# Patient Record
Sex: Male | Born: 1975 | Race: Black or African American | Hispanic: No | Marital: Single | Smoking: Former smoker
Health system: Southern US, Community
[De-identification: ages and names within clinical notes are randomized; demographics above are authoritative.]

---

## 2002-01-06 ENCOUNTER — Emergency Department (HOSPITAL_COMMUNITY): Admission: EM | Admit: 2002-01-06 | Discharge: 2002-01-06 | Payer: Self-pay | Admitting: Emergency Medicine

## 2002-01-07 ENCOUNTER — Emergency Department (HOSPITAL_COMMUNITY): Admission: EM | Admit: 2002-01-07 | Discharge: 2002-01-07 | Payer: Self-pay

## 2003-02-06 ENCOUNTER — Encounter: Payer: Self-pay | Admitting: Emergency Medicine

## 2003-02-06 ENCOUNTER — Emergency Department (HOSPITAL_COMMUNITY): Admission: EM | Admit: 2003-02-06 | Discharge: 2003-02-07 | Payer: Self-pay | Admitting: Emergency Medicine

## 2003-02-14 ENCOUNTER — Emergency Department (HOSPITAL_COMMUNITY): Admission: AD | Admit: 2003-02-14 | Discharge: 2003-02-14 | Payer: Self-pay | Admitting: Emergency Medicine

## 2007-10-15 ENCOUNTER — Emergency Department (HOSPITAL_COMMUNITY): Admission: EM | Admit: 2007-10-15 | Discharge: 2007-10-16 | Payer: Self-pay | Admitting: Emergency Medicine

## 2011-03-25 ENCOUNTER — Inpatient Hospital Stay (HOSPITAL_COMMUNITY)
Admission: RE | Admit: 2011-03-25 | Payer: Self-pay | Source: Ambulatory Visit | Admitting: Thoracic Surgery (Cardiothoracic Vascular Surgery)

## 2011-05-18 ENCOUNTER — Ambulatory Visit (INDEPENDENT_AMBULATORY_CARE_PROVIDER_SITE_OTHER): Payer: BC Managed Care – PPO

## 2011-05-18 DIAGNOSIS — Z79899 Other long term (current) drug therapy: Secondary | ICD-10-CM

## 2015-09-30 ENCOUNTER — Ambulatory Visit (INDEPENDENT_AMBULATORY_CARE_PROVIDER_SITE_OTHER): Payer: BLUE CROSS/BLUE SHIELD | Admitting: Family Medicine

## 2015-09-30 VITALS — BP 110/78 | HR 78 | Temp 97.7°F | Resp 16 | Ht 70.0 in | Wt 173.0 lb

## 2015-09-30 DIAGNOSIS — R635 Abnormal weight gain: Secondary | ICD-10-CM

## 2015-09-30 DIAGNOSIS — F418 Other specified anxiety disorders: Secondary | ICD-10-CM | POA: Diagnosis not present

## 2015-09-30 LAB — TSH: TSH: 1.15 mIU/L (ref 0.40–4.50)

## 2015-09-30 MED ORDER — SERTRALINE HCL 100 MG PO TABS
100.0000 mg | ORAL_TABLET | Freq: Every day | ORAL | Status: DC
Start: 2015-09-30 — End: 2016-01-03

## 2015-09-30 NOTE — Progress Notes (Signed)
Subjective:  By signing my name below, I, Eric Baldwin, attest that this documentation has been prepared under the direction and in the presence of Eric Ray, MD. Electronically Signed: Moises Baldwin, [place]. 09/30/2015 , 9:46 AM .  Patient was seen in Room 12 .   Patient ID: Eric Baldwin, male    DOB: March 23, 1976, 40 y.o.   MRN: [id number] Chief Complaint  Patient presents with  . Other    pt wants zoloft and wants to be seen for anxiety   HPI Eric Baldwin is a 40 y.o. male Here to be seen for anxiety.   Patient reports being a previous patient at [facility] psychiatry at [facility] for 1-2 years, last seen over a year ago. He states his insurance doesn't cover him there anymore. He was prescribed zoloft and wellbutrin at the same time for depression. He's been off of his medications for a little over a year. He's tried taking a friend's prozac, about 4-5 tablets. He denies any troubles coming off of his medications. He denies any complications when he was taking his medications.   He states anxiousness when driving started out of the blue for a little over a year. It occurs [place] he goes home to see his mother in [place]. He denies seeing a therapist. He had some unexpected weight gain with appetite change and some dryness in his skin. He also mentions some loss in concentration. He denies any cold or hot tolerance.   He's been trying to do push ups everyday and some weight exercises. He had suicidal thoughts a few years ago, but not recently. He stopped smoking cigarettes and now smokes vapor. He drinks about 3-4 shots on the weekends. He denies any alcohol problems.   He works as a Engineering geologist at [employer].   There are no active problems to display for this patient.  History reviewed. No pertinent past medical history. History reviewed. No pertinent past surgical history. No Known Allergies Prior to Admission medications   Not on File    Social History   Social History  . Marital Status: Single    Spouse Name: N/A  . Number of Children: N/A  . Years of Education: N/A   Occupational History  . Not on file.   Social History Main Topics  . Smoking status: Current Every Day Smoker  . Smokeless tobacco: Not on file  . Alcohol Use: Not on file  . Drug Use: Not on file  . Sexual Activity: Not on file   Other Topics Concern  . Not on file   Social History Narrative  . No narrative on file   Review of Systems  Constitutional: Positive for appetite change, fatigue and unexpected weight change. Negative for fever and chills.  Gastrointestinal: Negative for nausea, vomiting and diarrhea.  Endocrine: Negative for cold intolerance and heat intolerance.  Psychiatric/Behavioral: Positive for decreased concentration. Negative for suicidal ideas. The patient is nervous/anxious.        Objective:   Physical Exam  Constitutional: He is oriented to person, place, and time. He appears well-developed and well-nourished. No distress.  HENT:  Head: Normocephalic and atraumatic.  Eyes: EOM are normal. Pupils are equal, round, and reactive to light.  Neck: Neck supple.  Cardiovascular: Normal rate.   Pulmonary/Chest: Effort normal. No respiratory distress.  Musculoskeletal: Normal range of motion.  Neurological: He is alert and oriented to person, place, and time.  Skin: Skin is warm and dry.  Psychiatric: He  has a normal mood and affect. His behavior is normal.  Nursing note and vitals reviewed.   Filed Vitals:   09/30/15 0905  BP: 110/78  Pulse: 78  Temp: 97.7 F (36.5 C)  TempSrc: Oral  Resp: 16  Height: 5' 10"  (1.778 m)  Weight: 173 lb (78.472 kg)  SpO2: 98%      Assessment & Plan:   Faust Thorington is a 40 y.o. male Depression with anxiety - Plan: TSH, sertraline (ZOLOFT) 100 MG tablet  Weight gain - Plan: TSH  Previous history of depression with recent onset of anxiety past year. Situational anxiety  with driving primarily. Denies suicidal ideation or homicidal ideation. Denies alcohol abuse/addiction. Did discuss cutting back to 1-2 drinks at the most weekends.  -Restart Zoloft at 50 mg daily for 1 week, then increase to 100 mg daily, as he thinks he was at the higher dose previously. Stress management and relaxation techniques discussed, especially with driving. Decided against benzodiazepine as would not want to necessarily start this while he operates a vehicle.  -Phone numbers given for cognitive behavioral therapy/counseling.  -Recheck 4 weeks, sooner if worse. Records requested from previous psychiatrist.   Meds ordered this encounter  Medications  . sertraline (ZOLOFT) 100 MG tablet    Sig: Take 1 tablet (100 mg total) by mouth daily. Start with one half pill per day for first week, then increase to one pill per day.    Dispense:  30 tablet    Refill:  2   Patient Instructions       IF you received an x-Baldwin today, you will receive an invoice from [facility]. Please contact [facility] at [phone] with questions or concerns regarding your invoice.   IF you received labwork today, you will receive an invoice from [employer]. Please contact [facility] at [phone] with questions or concerns regarding your invoice.   Our billing staff will not be able to assist you with questions regarding bills from these companies.  You will be contacted with the lab results as soon as they are available. The fastest way to get your results is to activate your My Chart account. Instructions are located on the last page of this paperwork. If you have not heard from [place] regarding the results in 2 weeks, please contact this office.     Start medication once per day, call one of the mental health providers below to schedule counseling to help with management of anxiety symptoms, and continue some form of exercise each day. Follow-up with me in the  next 4 weeks, sooner if worse. Work on IT trainer as we discussed.   Return to the clinic or go to the nearest emergency room if any of your symptoms worsen or new symptoms occur.  Vivia Budge: [phone] Arvil Chaco: [phone]   Stress and Stress Management Stress is a normal reaction to life events. It is what you feel when life demands more than you are used to or more than you can handle. Some stress can be useful. For example, the stress reaction can help you catch the last bus of the day, study for a test, or meet a deadline at work. But stress that occurs too often or for too long can cause problems. It can affect your emotional health and interfere with relationships and normal daily activities. Too much stress can weaken your immune system and increase your risk for physical illness. If you already have a medical problem, stress can make it  worse. CAUSES  All sorts of life events may cause stress. An event that causes stress for one person may not be stressful for another person. Major life events commonly cause stress. These may be positive or negative. Examples include losing your job, moving into a new home, getting married, having a baby, or losing a loved one. Less obvious life events may also cause stress, especially if they occur day after day or in combination. Examples include working long hours, driving in traffic, caring for children, being in debt, or being in a difficult relationship. SIGNS AND SYMPTOMS Stress may cause emotional symptoms including, the following:  Anxiety. This is feeling worried, afraid, on edge, overwhelmed, or out of control.  Anger. This is feeling irritated or impatient.  Depression. This is feeling sad, down, helpless, or guilty.  Difficulty focusing, remembering, or making decisions. Stress may cause physical symptoms, including the following:   Aches and pains. These may affect your head, neck, back, stomach, or other areas of your  body.  Tight muscles or clenched jaw.  Low energy or trouble sleeping. Stress may cause unhealthy behaviors, including the following:   Eating to feel better (overeating) or skipping meals.  Sleeping too little, too much, or both.  Working too much or putting off tasks (procrastination).  Smoking, drinking alcohol, or using drugs to feel better. DIAGNOSIS  Stress is diagnosed through an assessment by your health care provider. Your health care provider will ask questions about your symptoms and any stressful life events.Your health care provider will also ask about your medical history and may order Baldwin tests or other tests. Certain medical conditions and medicine can cause physical symptoms similar to stress. Mental illness can cause emotional symptoms and unhealthy behaviors similar to stress. Your health care provider may refer you to a mental health professional for further evaluation.  TREATMENT  Stress management is the recommended treatment for stress.The goals of stress management are reducing stressful life events and coping with stress in healthy ways.  Techniques for reducing stressful life events include the following:  Stress identification. Self-monitor for stress and identify what causes stress for you. These skills may help you to avoid some stressful events.  Time management. Set your priorities, keep a calendar of events, and learn to say "no." These tools can help you avoid making too many commitments. Techniques for coping with stress include the following:  Rethinking the problem. Try to think realistically about stressful events rather than ignoring them or overreacting. Try to find the positives in a stressful situation rather than focusing on the negatives.  Exercise. Physical exercise can release both physical and emotional tension. The key is to find a form of exercise you enjoy and do it regularly.  Relaxation techniques. These relax the body and mind.  Examples include yoga, meditation, tai chi, biofeedback, deep breathing, progressive muscle relaxation, listening to music, being out in nature, journaling, and other hobbies. Again, the key is to find one or more that you enjoy and can do regularly.  Healthy lifestyle. Eat a balanced diet, get plenty of sleep, and do not smoke. Avoid using alcohol or drugs to relax.  Strong support network. Spend time with family, friends, or other people you enjoy being around.Express your feelings and talk things over with someone you trust. Counseling or talktherapy with a mental health professional may be helpful if you are having difficulty managing stress on your own. Medicine is typically not recommended for the treatment of stress.Talk to your health care  provider if you think you need medicine for symptoms of stress. HOME CARE INSTRUCTIONS  Keep all follow-up visits as directed by your health care provider.  Take all medicines as directed by your health care provider. SEEK MEDICAL CARE IF:  Your symptoms get worse or you start having new symptoms.  You feel overwhelmed by your problems and can no longer manage them on your own. SEEK IMMEDIATE MEDICAL CARE IF:  You feel like hurting yourself or someone else.   This information is not intended to replace advice given to you by your health care provider. Make sure you discuss any questions you have with your health care provider.   Document Released: 11/17/2000 Document Revised: 06/14/2014 Document Reviewed: 01/16/2013 Elsevier Interactive Patient Education [employer].     I personally performed the services described in this documentation, which was scribed in my presence. The recorded information has been reviewed and considered, and addended by me as needed.

## 2015-09-30 NOTE — Patient Instructions (Addendum)
IF you received an x-ray today, you will receive an invoice from [facility]. Please contact [facility] at [phone] with questions or concerns regarding your invoice.   IF you received labwork today, you will receive an invoice from [facility] [employer]. Please contact [facility] at [phone] with questions or concerns regarding your invoice.   Our billing staff will not be able to assist you with questions regarding bills from these companies.  You will be contacted with the lab results as soon as they are available. The fastest way to get your results is to activate your My Chart account. Instructions are located on the last page of this paperwork. If you have not heard from [place] regarding the results in 2 weeks, please contact this office.     Start medication once per day, call one of the mental health providers below to schedule counseling to help with management of anxiety symptoms, and continue some form of exercise each day. Follow-up with me in the next 4 weeks, sooner if worse. Work on IT trainer as we discussed.   Return to the clinic or go to the nearest emergency room if any of your symptoms worsen or new symptoms occur.  Vivia Budge: [phone] Arvil Chaco: [phone]   Stress and Stress Management Stress is a normal reaction to life events. It is what you feel when life demands more than you are used to or more than you can handle. Some stress can be useful. For example, the stress reaction can help you catch the last bus of the day, study for a test, or meet a deadline at work. But stress that occurs too often or for too long can cause problems. It can affect your emotional health and interfere with relationships and normal daily activities. Too much stress can weaken your immune system and increase your risk for physical illness. If you already have a medical problem, stress can make it worse. CAUSES  All sorts of life  events may cause stress. An event that causes stress for one person may not be stressful for another person. Major life events commonly cause stress. These may be positive or negative. Examples include losing your job, moving into a new home, getting married, having a baby, or losing a loved one. Less obvious life events may also cause stress, especially if they occur day after day or in combination. Examples include working long hours, driving in traffic, caring for children, being in debt, or being in a difficult relationship. SIGNS AND SYMPTOMS Stress may cause emotional symptoms including, the following:  Anxiety. This is feeling worried, afraid, on edge, overwhelmed, or out of control.  Anger. This is feeling irritated or impatient.  Depression. This is feeling sad, down, helpless, or guilty.  Difficulty focusing, remembering, or making decisions. Stress may cause physical symptoms, including the following:   Aches and pains. These may affect your head, neck, back, stomach, or other areas of your body.  Tight muscles or clenched jaw.  Low energy or trouble sleeping. Stress may cause unhealthy behaviors, including the following:   Eating to feel better (overeating) or skipping meals.  Sleeping too little, too much, or both.  Working too much or putting off tasks (procrastination).  Smoking, drinking alcohol, or using drugs to feel better. DIAGNOSIS  Stress is diagnosed through an assessment by your health care provider. Your health care provider will ask questions about your symptoms and any stressful life events.Your health care provider will also ask about  your medical history and may order blood tests or other tests. Certain medical conditions and medicine can cause physical symptoms similar to stress. Mental illness can cause emotional symptoms and unhealthy behaviors similar to stress. Your health care provider may refer you to a mental health professional for further  evaluation.  TREATMENT  Stress management is the recommended treatment for stress.The goals of stress management are reducing stressful life events and coping with stress in healthy ways.  Techniques for reducing stressful life events include the following:  Stress identification. Self-monitor for stress and identify what causes stress for you. These skills may help you to avoid some stressful events.  Time management. Set your priorities, keep a calendar of events, and learn to say "no." These tools can help you avoid making too many commitments. Techniques for coping with stress include the following:  Rethinking the problem. Try to think realistically about stressful events rather than ignoring them or overreacting. Try to find the positives in a stressful situation rather than focusing on the negatives.  Exercise. Physical exercise can release both physical and emotional tension. The key is to find a form of exercise you enjoy and do it regularly.  Relaxation techniques. These relax the body and mind. Examples include yoga, meditation, tai chi, biofeedback, deep breathing, progressive muscle relaxation, listening to music, being out in nature, journaling, and other hobbies. Again, the key is to find one or more that you enjoy and can do regularly.  Healthy lifestyle. Eat a balanced diet, get plenty of sleep, and do not smoke. Avoid using alcohol or drugs to relax.  Strong support network. Spend time with family, friends, or other people you enjoy being around.Express your feelings and talk things over with someone you trust. Counseling or talktherapy with a mental health professional may be helpful if you are having difficulty managing stress on your own. Medicine is typically not recommended for the treatment of stress.Talk to your health care provider if you think you need medicine for symptoms of stress. HOME CARE INSTRUCTIONS  Keep all follow-up visits as directed by your health  care provider.  Take all medicines as directed by your health care provider. SEEK MEDICAL CARE IF:  Your symptoms get worse or you start having new symptoms.  You feel overwhelmed by your problems and can no longer manage them on your own. SEEK IMMEDIATE MEDICAL CARE IF:  You feel like hurting yourself or someone else.   This information is not intended to replace advice given to you by your health care provider. Make sure you discuss any questions you have with your health care provider.   Document Released: 11/17/2000 Document Revised: 06/14/2014 Document Reviewed: 01/16/2013 Elsevier Interactive Patient Education [employer].

## 2016-01-03 ENCOUNTER — Other Ambulatory Visit: Payer: Self-pay | Admitting: Family Medicine

## 2016-01-03 DIAGNOSIS — F418 Other specified anxiety disorders: Secondary | ICD-10-CM

## 2016-02-06 ENCOUNTER — Other Ambulatory Visit: Payer: Self-pay | Admitting: Family Medicine

## 2016-02-06 DIAGNOSIS — F418 Other specified anxiety disorders: Secondary | ICD-10-CM

## 2016-02-13 ENCOUNTER — Ambulatory Visit (INDEPENDENT_AMBULATORY_CARE_PROVIDER_SITE_OTHER): Payer: BLUE CROSS/BLUE SHIELD | Admitting: Physician Assistant

## 2016-02-13 ENCOUNTER — Encounter: Payer: Self-pay | Admitting: Physician Assistant

## 2016-02-13 VITALS — BP 122/70 | HR 94 | Temp 98.2°F | Resp 18 | Ht 70.0 in | Wt 182.0 lb

## 2016-02-13 DIAGNOSIS — Z23 Encounter for immunization: Secondary | ICD-10-CM

## 2016-02-13 DIAGNOSIS — F418 Other specified anxiety disorders: Secondary | ICD-10-CM

## 2016-02-13 MED ORDER — SERTRALINE HCL 25 MG PO TABS: 25.0000 mg | ORAL_TABLET | Freq: Every day | ORAL | 3 refills | Status: DC

## 2016-02-13 MED ORDER — SERTRALINE HCL 100 MG PO TABS: 100.0000 mg | ORAL_TABLET | Freq: Every day | ORAL | 3 refills | Status: DC

## 2016-02-13 NOTE — Patient Instructions (Addendum)
   IF you received an x-ray today, you will receive an invoice from [facility]. Please contact [facility] at [phone] with questions or concerns regarding your invoice.   IF you received labwork today, you will receive an invoice from [facility]. Please contact [facility] at [phone] with questions or concerns regarding your invoice.   Our billing staff will not be able to assist you with questions regarding bills from these companies.  You will be contacted with the lab results as soon as they are available. The fastest way to get your results is to activate your My Chart account. Instructions are located on the last page of this paperwork. If you have not heard from us regarding the results in 2 weeks, please contact this office.    Influenza (Flu) Vaccine (Inactivated or Recombinant):  1. Why get vaccinated? Influenza ("flu") is a contagious disease that spreads around the [place] every year, usually between October and May. Flu is caused by influenza viruses, and is spread mainly by coughing, sneezing, and close contact. Anyone can get flu. Flu strikes suddenly and can last several days. Symptoms vary by age, but can include:  fever/chills  sore throat  muscle aches  fatigue  cough  headache  runny or stuffy nose Flu can also lead to pneumonia and blood infections, and cause diarrhea and seizures in children. If you have a medical condition, such as heart or lung disease, flu can make it worse. Flu is more dangerous for some people. Infants and young children, people 65 years of age and older, pregnant women, and people with certain health conditions or a weakened immune system are at greatest risk. Each year thousands of people in the [place] die from flu, and many more are hospitalized. Flu vaccine can:  keep you from getting flu,  make flu less severe if you do get it, and  keep you from spreading flu to  your family and other people. 2. Inactivated and recombinant flu vaccines A dose of flu vaccine is recommended every flu season. Children 6 months through 8 years of age may need two doses during the same flu season. Everyone else needs only one dose each flu season. Some inactivated flu vaccines contain a very small amount of a mercury-based preservative called thimerosal. Studies have not shown thimerosal in vaccines to be harmful, but flu vaccines that do not contain thimerosal are available. There is no live flu virus in flu shots. They cannot cause the flu. There are many flu viruses, and they are always changing. Each year a new flu vaccine is made to protect against three or four viruses that are likely to cause disease in the upcoming flu season. But even when the vaccine doesn't exactly match these viruses, it may still provide some protection. Flu vaccine cannot prevent:  flu that is caused by a virus not covered by the vaccine, or  illnesses that look like flu but are not. It takes about 2 weeks for protection to develop after vaccination, and protection lasts through the flu season. 3. Some people should not get this vaccine Tell the person who is giving you the vaccine:  If you have any severe, life-threatening allergies. If you ever had a life-threatening allergic reaction after a dose of flu vaccine, or have a severe allergy to any part of this vaccine, you may be advised not to get vaccinated. Most, but not all, types of flu vaccine contain a small amount of egg protein.    If you ever had Guillain-Barre Syndrome (also called GBS). Some people with a history of GBS should not get this vaccine. This should be discussed with your doctor.  If you are not feeling well. It is usually okay to get flu vaccine when you have a mild illness, but you might be asked to come back when you feel better. 4. Risks of a vaccine reaction With any medicine, including vaccines, there is a chance of  reactions. These are usually mild and go away on their own, but serious reactions are also possible. Most people who get a flu shot do not have any problems with it. Minor problems following a flu shot include:  soreness, redness, or swelling where the shot was given  hoarseness  sore, red or itchy eyes  cough  fever  aches  headache  itching  fatigue If these problems occur, they usually begin soon after the shot and last 1 or 2 days. More serious problems following a flu shot can include the following:  There may be a small increased risk of Guillain-Barre Syndrome (GBS) after inactivated flu vaccine. This risk has been estimated at 1 or 2 additional cases per million people vaccinated. This is much lower than the risk of severe complications from flu, which can be prevented by flu vaccine.  Young children who get the flu shot along with pneumococcal vaccine (PCV13) and/or DTaP vaccine at the same time might be slightly more likely to have a seizure caused by fever. Ask your doctor for more information. Tell your doctor if a child who is getting flu vaccine has ever had a seizure. Problems that could happen after any injected vaccine:  People sometimes faint after a medical procedure, including vaccination. Sitting or lying down for about 15 minutes can help prevent fainting, and injuries caused by a fall. Tell your doctor if you feel dizzy, or have vision changes or ringing in the ears.  Some people get severe pain in the shoulder and have difficulty moving the arm where a shot was given. This happens very rarely.  Any medication can cause a severe allergic reaction. Such reactions from a vaccine are very rare, estimated at about 1 in a million doses, and would happen within a few minutes to a few hours after the vaccination. As with any medicine, there is a very remote chance of a vaccine causing a serious injury or death. The safety of vaccines is always being monitored. For  more information, visit: www.cdc.gov/vaccinesafety/ 5. What if there is a serious reaction? What should I look for?  Look for anything that concerns you, such as signs of a severe allergic reaction, very high fever, or unusual behavior. Signs of a severe allergic reaction can include hives, swelling of the face and throat, difficulty breathing, a fast heartbeat, dizziness, and weakness. These would start a few minutes to a few hours after the vaccination. What should I do?  If you think it is a severe allergic reaction or other emergency that can't wait, call 9-1-1 and get the person to the nearest hospital. Otherwise, call your doctor.  Reactions should be reported to the Vaccine Adverse Event Reporting System (VAERS). Your doctor should file this report, or you can do it yourself through the VAERS web site at www.vaers.hhs.gov, or by calling 1-800-822-7967. VAERS does not give medical advice. 6. The National Vaccine Injury Compensation Program The National Vaccine Injury Compensation Program (VICP) is a federal program that was created to compensate people who may have been   injured by certain vaccines. Persons who believe they may have been injured by a vaccine can learn about the program and about filing a claim by calling 1-800-338-2382 or visiting the VICP website at www.hrsa.gov/vaccinecompensation. There is a time limit to file a claim for compensation. 7. How can I learn more?  Ask your healthcare provider. He or she can give you the vaccine package insert or suggest other sources of information.  Call your local or state health department.  Contact the Centers for Disease Control and Prevention (CDC):  Call 1-800-232-4636 (1-800-CDC-INFO) or  Visit CDC's website at www.cdc.gov/flu Vaccine Information Statement Inactivated Influenza Vaccine (01/11/2014)   This information is not intended to replace advice given to you by your health care provider. Make sure you discuss any  questions you have with your health care provider.   Document Released: 03/18/2006 Document Revised: 06/14/2014 Document Reviewed: 01/14/2014 Elsevier Interactive Patient Education 2016 Elsevier Inc.  

## 2016-02-13 NOTE — Progress Notes (Signed)
   02/13/2016 2:21 PM   DOB: Aug 18, 1975 / MRN: [id number]  SUBJECTIVE:  Eric MangesMichael Baldwin is a 40 y.o. male presenting for refill of Zoloft.  He takes the for anxiety and depression and reports that he is feeling better since starting the medication.  Reports "my appetite is better and I don't feel anxious in crowds anymore."  He is sleeping well.  He does feel that he could use a higher dose as he still feels some anxiety with his work at the [employer].    Depression screen [facility] 02/13/2016  Decreased Interest 0  Down, Depressed, Hopeless 0  PHQ - 2 Score 0     .  He has No Known Allergies.   He  has no past medical history on file.    He  reports that he has been smoking.  He has never used smokeless tobacco. He reports that he drinks alcohol. He reports that he does not use drugs. He  has no sexual activity history on file. The patient  has no past surgical history on file.  His family history is not on file.  Review of Systems  Psychiatric/Behavioral: Negative for depression, hallucinations, memory loss, substance abuse and suicidal ideas. The patient is nervous/anxious. The patient does not have insomnia.     The problem list and medications were reviewed and updated by myself where necessary and exist elsewhere in the encounter.   OBJECTIVE:  BP 122/70 (BP Location: Right Arm, Patient Position: Sitting, Cuff Size: Small)   Pulse 94   Temp 98.2 F (36.8 C) (Oral)   Resp 18   Ht 5\' 10"  (1.778 m)   Wt 182 lb (82.6 kg)   SpO2 97%   BMI 26.11 kg/m   Physical Exam  Constitutional: He is oriented to person, place, and time. He appears well-developed and well-nourished. No distress.  HENT:  Mouth/Throat: Oropharynx is clear and moist. No oropharyngeal exudate.  Cardiovascular: Normal rate and regular rhythm.   Pulmonary/Chest: Effort normal and breath sounds normal.  Musculoskeletal: Normal range of motion.  Neurological: He is alert and oriented to person, place, and  time. He displays normal reflexes. No cranial nerve deficit. He exhibits normal muscle tone. Coordination normal.  Skin: Skin is warm and dry. He is not diaphoretic.  Psychiatric: He has a normal mood and affect.    No results found for this or any previous visit (from the past 72 hour(s)).  No results found.  ASSESSMENT AND PLAN  Casimiro NeedleMichael was seen today for medication refill.  Diagnoses and all orders for this visit:  Needs flu shot -     Flu Vaccine QUAD 36+ mos IM  Depression with anxiety: He is doing well but feels that he could use a higher dose due to some persistent anxiousness. Will increase his dose to 125 daily. Follow up in 3 or 6 or 12 months, depending on how he is feeling.  -     sertraline (ZOLOFT) 100 MG tablet; Take 1 tablet (100 mg total) by mouth daily. -     sertraline (ZOLOFT) 25 MG tablet; Take 1 tablet (25 mg total) by mouth daily.    The patient is advised to call or return to clinic if he does not see an improvement in symptoms, or to seek the care of the closest emergency department if he worsens with the above plan.   Deliah BostonMichael Takeyla Baldwin, MHS, PA-C [facility] 02/13/2016 2:21 PM

## 2016-10-26 ENCOUNTER — Ambulatory Visit (INDEPENDENT_AMBULATORY_CARE_PROVIDER_SITE_OTHER): Payer: BLUE CROSS/BLUE SHIELD | Admitting: Student

## 2016-10-26 ENCOUNTER — Encounter: Payer: Self-pay | Admitting: Student

## 2016-10-26 VITALS — BP 125/82 | HR 65 | Temp 97.8°F | Resp 18 | Ht 69.33 in | Wt 170.6 lb

## 2016-10-26 DIAGNOSIS — R300 Dysuria: Secondary | ICD-10-CM | POA: Diagnosis not present

## 2016-10-26 DIAGNOSIS — Z113 Encounter for screening for infections with a predominantly sexual mode of transmission: Secondary | ICD-10-CM | POA: Diagnosis not present

## 2016-10-26 DIAGNOSIS — Z202 Contact with and (suspected) exposure to infections with a predominantly sexual mode of transmission: Secondary | ICD-10-CM | POA: Diagnosis not present

## 2016-10-26 LAB — POC MICROSCOPIC URINALYSIS (UMFC): MUCUS RE: ABSENT

## 2016-10-26 LAB — POCT URINALYSIS DIP (MANUAL ENTRY)
Bilirubin, UA: NEGATIVE
Glucose, UA: NEGATIVE mg/dL
Ketones, POC UA: NEGATIVE mg/dL
Nitrite, UA: NEGATIVE
PH UA: 6 (ref 5.0–8.0)
SPEC GRAV UA: 1.025 (ref 1.010–1.025)
UROBILINOGEN UA: 1 U/dL

## 2016-10-26 MED ORDER — CEFTRIAXONE SODIUM 1 G IJ SOLR
250.0000 mg | Freq: Once | INTRAMUSCULAR | Status: AC
  Administered 2016-10-26: 250 mg via INTRAMUSCULAR

## 2016-10-26 MED ORDER — AZITHROMYCIN 250 MG PO TABS: ORAL_TABLET | ORAL | 0 refills | Status: DC
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
## 2016-10-26 NOTE — Patient Instructions (Signed)
     IF you received an x-ray today, you will receive an invoice from [facility]. Please contact [facility] at [phone] with questions or concerns regarding your invoice.   IF you received labwork today, you will receive an invoice from [facility]. Please contact [facility] at [phone] with questions or concerns regarding your invoice.   Our billing staff will not be able to assist you with questions regarding bills from these companies.  You will be contacted with the lab results as soon as they are available. The fastest way to get your results is to activate your My Chart account. Instructions are located on the last page of this paperwork. If you have not heard from us regarding the results in 2 weeks, please contact this office.     

## 2016-10-26 NOTE — Assessment & Plan Note (Signed)
Will screen for gonorrhea and chlamydia.  Did extensive counseling on screening for HIV and syphilis, but pt refused.  Will empirically treat for gonorrhea and chlamydia- azithromycin and rocephin.

## 2016-10-27 LAB — GC/CHLAMYDIA PROBE AMP
Chlamydia trachomatis, NAA: NEGATIVE
Neisseria gonorrhoeae by PCR: POSITIVE — AB

## 2016-10-28 ENCOUNTER — Encounter: Payer: Self-pay | Admitting: Student

## 2016-10-28 NOTE — Progress Notes (Signed)
Results confirm gonorrhea infection.  Pt already empirically treated.  Signed,  Corliss MarcusAlicia Barnes, DO [facility] 8:43 AM 10/28/16

## 2017-01-11 ENCOUNTER — Ambulatory Visit (INDEPENDENT_AMBULATORY_CARE_PROVIDER_SITE_OTHER): Payer: BLUE CROSS/BLUE SHIELD | Admitting: Physician Assistant

## 2017-01-11 ENCOUNTER — Encounter: Payer: Self-pay | Admitting: Physician Assistant

## 2017-01-11 ENCOUNTER — Ambulatory Visit: Payer: BLUE CROSS/BLUE SHIELD

## 2017-01-11 ENCOUNTER — Ambulatory Visit (INDEPENDENT_AMBULATORY_CARE_PROVIDER_SITE_OTHER): Payer: BLUE CROSS/BLUE SHIELD

## 2017-01-11 VITALS — BP 127/84 | HR 84 | Temp 97.8°F | Resp 18 | Ht 70.35 in | Wt 179.8 lb

## 2017-01-11 DIAGNOSIS — S0993XA Unspecified injury of face, initial encounter: Secondary | ICD-10-CM | POA: Diagnosis not present

## 2017-01-11 DIAGNOSIS — S76311A Strain of muscle, fascia and tendon of the posterior muscle group at thigh level, right thigh, initial encounter: Secondary | ICD-10-CM | POA: Diagnosis not present

## 2017-01-11 MED ORDER — KETOROLAC TROMETHAMINE 60 MG/2ML IM SOLN
60.0000 mg | Freq: Once | INTRAMUSCULAR | Status: AC
  Administered 2017-01-11: 60 mg via INTRAMUSCULAR

## 2017-01-11 MED ORDER — CYCLOBENZAPRINE HCL 10 MG PO TABS: 5.0000 mg | ORAL_TABLET | Freq: Three times a day (TID) | ORAL | 0 refills | Status: DC | PRN

## 2017-01-11 MED ORDER — NAPROXEN 500 MG PO TABS: 500.0000 mg | ORAL_TABLET | Freq: Two times a day (BID) | ORAL | 0 refills | Status: DC

## 2017-01-11 NOTE — Patient Instructions (Addendum)
  If you are getting worse then come back and see me.  If you develop pain with eye movements then please go to the ED.    IF you received an x-ray today, you will receive an [facility]. Please contact [facility] at [phone] with questions or concerns regarding your invoice.   IF you received labwork today, you will receive an invoice from [facility]. Please contact [facility] at [phone] with questions or concerns regarding your invoice.   Our billing staff will not be able to assist you with questions regarding bills from these companies.  You will be contacted with the lab results as soon as they are available. The fastest way to get your results is to activate your My Chart account. Instructions are located on the last page of this paperwork. If you have not heard from us regarding the results in 2 weeks, please contact this office.
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
## 2017-03-03 ENCOUNTER — Other Ambulatory Visit: Payer: Self-pay | Admitting: Physician Assistant

## 2017-03-03 DIAGNOSIS — F418 Other specified anxiety disorders: Secondary | ICD-10-CM

## 2017-03-05 ENCOUNTER — Ambulatory Visit: Payer: BLUE CROSS/BLUE SHIELD | Admitting: Urgent Care

## 2017-03-07 ENCOUNTER — Ambulatory Visit: Payer: BLUE CROSS/BLUE SHIELD | Admitting: Urgent Care

## 2017-07-13 ENCOUNTER — Telehealth: Payer: Self-pay | Admitting: Family Medicine

## 2017-07-13 NOTE — Telephone Encounter (Signed)
Called to remind pt of appt tomorrow.  Reminded pt of 15 minutes early, building number and 10 minute late policy.

## 2017-07-14 ENCOUNTER — Other Ambulatory Visit: Payer: Self-pay

## 2017-07-14 ENCOUNTER — Encounter: Payer: Self-pay | Admitting: Family Medicine

## 2017-07-14 ENCOUNTER — Ambulatory Visit: Payer: BLUE CROSS/BLUE SHIELD | Admitting: Family Medicine

## 2017-07-14 VITALS — BP 118/70 | HR 89 | Temp 97.8°F | Resp 16 | Ht 70.35 in | Wt 165.8 lb

## 2017-07-14 DIAGNOSIS — F418 Other specified anxiety disorders: Secondary | ICD-10-CM

## 2017-07-14 DIAGNOSIS — Z23 Encounter for immunization: Secondary | ICD-10-CM

## 2017-07-14 MED ORDER — SERTRALINE HCL 100 MG PO TABS: 100.0000 mg | ORAL_TABLET | Freq: Every day | ORAL | 1 refills | Status: DC

## 2017-07-14 NOTE — Patient Instructions (Addendum)
Restart Zoloft once per day at 100mg . Counselor numbers below - please call to schedule appointment. Recheck in 6 weeks to decide if changes are needed. Return to the clinic or go to the nearest emergency room if any of your symptoms worsen or new symptoms occur.  Karmen Bongo: [phone] [place] Ped: [phone]   Major Depressive Disorder, Adult Major depressive disorder (MDD) is a mental health condition. MDD often makes you feel sad, hopeless, or helpless. MDD can also cause symptoms in your body. MDD can affect your:  Work.  School.  Relationships.  Other normal activities.  MDD can range from mild to very bad. It may occur once (single episode MDD). It can also occur many times (recurrent MDD). The main symptoms of MDD often include:  Feeling sad, depressed, or irritable most of the time.  Loss of interest.  MDD symptoms also include:  Sleeping too much or too little.  Eating too much or too little.  A change in your weight.  Feeling tired (fatigue) or having low energy.  Feeling worthless.  Feeling guilty.  Trouble making decisions.  Trouble thinking clearly.  Thoughts of suicide or harming others.  Feeling weak.  Feeling agitated.  Keeping yourself from being around other people (isolation).  Follow these instructions at home: Activity  Do these things as told by your doctor: ? Go back to your normal activities. ? Exercise regularly. ? Spend time outdoors. Alcohol  Talk with your doctor about how alcohol can affect your antidepressant medicines.  Do not drink alcohol. Or, limit how much alcohol you drink. ? This means no more than 1 drink a day for nonpregnant women and 2 drinks a day for men. One drink equals one of these:  12 oz of beer.  5 oz of wine.  1 oz of hard liquor. General instructions  Take over-the-counter and prescription medicines only as told by your doctor.  Eat a healthy diet.  Get plenty of sleep.  Find activities  that you enjoy. Make time to do them.  Think about joining a support group. Your doctor may be able to suggest a group for you.  Keep all follow-up visits as told by your doctor. This is important. Where to find more information:  The First American on Mental Illness: ? www.nami.org  U.S. [employer] of Mental Health: ? http://www.maynard.net/  National Suicide Prevention Lifeline: ? 731-487-9351. This is free, 24-hour help. Contact a doctor if:  Your symptoms get worse.  You have new symptoms. Get help right away if:  You self-harm.  You see, hear, taste, smell, or feel things that are not present (hallucinate). If you ever feel like you may hurt yourself or others, or have thoughts about taking your own life, get help right away. You can go to your nearest emergency department or call:  Your local emergency services (911 in the [place]  A suicide crisis helpline, such as the National Suicide Prevention Lifeline: ? 216-792-9770. This is open 24 hours a day.  This information is not intended to replace advice given to you by your health care provider. Make sure you discuss any questions you have with your health care provider. Document Released: 05/05/2015 Document Revised: 02/08/2016 Document Reviewed: 02/08/2016 Elsevier Interactive Patient Education  2017 [employer].    IF you received an x-ray today, you will receive an invoice from [facility]. Please contact [facility] at [phone] with questions or concerns regarding your invoice.   IF you received labwork today, you will receive an  invoice from [facility]. Please contact [facility] at [phone] with questions or concerns regarding your invoice.   Our billing staff will not be able to assist you with questions regarding bills from these companies.  You will be contacted with the lab results as soon as they are available. The fastest way to get your results is to activate your My Chart  account. Instructions are located on the last page of this paperwork. If you have not heard from us regarding the results in 2 weeks, please contact this office.

## 2017-07-14 NOTE — Progress Notes (Signed)
Subjective:  This chart was scribed for Shade Flood, MD by Veverly Fells, at Primary Care at [facility].  This patient was seen in room 10 and the patient's care was started at 8:23 AM.   Chief Complaint  Patient presents with  . Depression    patient presents for discussion on medication, would like to restart Zoloft with higher dose     Patient ID: Eric Baldwin, male    DOB: Mar 15, 1976, 42 y.o.   MRN: [id number]  HPI HPI Comments: Eric Baldwin is a 42 y.o. male who presents to Primary Care at [facility] for to discuss depression.  He last saw me almost two years ago in April 2017. A that time had not seen psychiatry for approximately one year and had been off Zoloft and Wellbutrin for one year. He was having some anxiety symptoms at that time, some moderate exercise, few alcohol drinks on the weekend.  Restarted Zoloft 50 mg QD for one week, then to increase to 100 mg.  Phone number was given for counseling and advised to follow up in four weeks. This is his first visit back with me. He did see Joriel Streety September 8th 2017 with increased Zoloft to 125 mg QD.--- Patient is not currently on any medications and was last taking Zoloft 125 mg in July of last year, denies any side effects while he was on this medication.  He states that he has lost weight since coming off of this as he had not had the urge to eat and feels that he had a more balanced schedule while he was on it (was exercising more regularly).  Patient is also having a decreased drive to do things but states that it is not to the point where he "wants to end his life".  He was not able to meet with a counselor and "will think about" seeing one.  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 He denies any elicit drug use and drinks about two drinks (when at the bar) / intermittently through out the week.   Patients father died in 04/22/2023 or 2015/10/20 and has been having a rough time since this  happened.  He does talk to his brother/mother from time to time and has guilt of not saying "I love you" enough.   Depression screen [facility] 2/9 07/14/2017 01/11/2017 10/26/2016 02/13/2016 09/30/2015  Decreased Interest 3 0 0 0 0  Down, Depressed, Hopeless 1 0 0 0 1  PHQ - 2 Score 4 0 0 0 1  Altered sleeping 2 - - - -  Tired, decreased energy 0 - - - -  Change in appetite 2 - - - -  Feeling bad or failure about yourself  2 - - - -  Trouble concentrating 2 - - - -  Moving slowly or fidgety/restless 2 - - - -  Suicidal thoughts 0 - - - -  PHQ-9 Score 14 - - - -  Difficult doing work/chores Very difficult - - - -     Patient Active Problem List   Diagnosis Date Noted  . Screen for STD (sexually transmitted disease) 10/26/2016  . Dysuria 10/26/2016   No past medical history on file. No past surgical history on file. No Known Allergies Prior to Admission medications   Medication Sig Start Date End Date Taking? Authorizing Provider  azithromycin (ZITHROMAX) 250 MG tablet Take 4 tablets PO once 10/26/16   Chitanand, Alicia B, DO  cyclobenzaprine (FLEXERIL) 10  MG tablet Take 0.5-1 tablets (5-10 mg total) by mouth 3 (three) times daily as needed. 01/11/17   Ofilia Neas, PA-C  naproxen (NAPROSYN) 500 MG tablet Take 1 tablet (500 mg total) by mouth 2 (two) times daily with a meal. 01/11/17   Ofilia Neas, PA-C  sertraline (ZOLOFT) 100 MG tablet Take 1 tablet (100 mg total) by mouth daily. 02/13/16   Ofilia Neas, PA-C  sertraline (ZOLOFT) 25 MG tablet Take 1 tablet (25 mg total) by mouth daily. 02/13/16   Ofilia Neas, PA-C   Social History   Socioeconomic History  . Marital status: Single    Spouse name: Not on file  . Number of children: Not on file  . Years of education: Not on file  . Highest education level: Not on file  Social Needs  . Financial resource strain: Not on file  . Food insecurity - worry: Not on file  . Food insecurity - inability: Not on file  . Transportation needs  - medical: Not on file  . Transportation needs - non-medical: Not on file  Occupational History  . Not on file  Tobacco Use  . Smoking status: Current Every Day Smoker    Types: E-cigarettes  . Smokeless tobacco: Never Used  Substance and Sexual Activity  . Alcohol use: Yes    Alcohol/week: 6.0 oz    Types: 5 Cans of beer, 5 Shots of liquor per week  . Drug use: No  . Sexual activity: Not on file  Other Topics Concern  . Not on file  Social History Narrative  . Not on file    Review of Systems  Constitutional: Positive for appetite change. Negative for chills and fever.  Eyes: Negative for pain and redness.  Respiratory: Negative for cough, choking and shortness of breath.   Gastrointestinal: Negative for nausea and vomiting.  Neurological: Negative for syncope and speech difficulty.  Psychiatric/Behavioral: Positive for dysphoric mood. Negative for suicidal ideas.      Objective:   Physical Exam  Constitutional: He is oriented to person, place, and time. He appears well-developed and well-nourished.  HENT:  Head: Normocephalic and atraumatic.  Eyes: EOM are normal. Pupils are equal, round, and reactive to light.  Neck: No JVD present. Carotid bruit is not present.  No palpable nodules, no thyromegaly.   Cardiovascular: Normal rate, regular rhythm and normal heart sounds.  No murmur heard. Pulmonary/Chest: Effort normal and breath sounds normal. He has no rales.  Musculoskeletal: He exhibits no edema.  Neurological: He is alert and oriented to person, place, and time.  Skin: Skin is warm and dry.  Psychiatric: He has a normal mood and affect.  Vitals reviewed.  Vitals:   07/14/17 0813  BP: 118/70  Pulse: 89  Resp: 16  Temp: 97.8 F (36.6 C)  TempSrc: Oral  SpO2: 98%  Weight: 165 lb 12.8 oz (75.2 kg)  Height: 5' 10.35" (1.787 m)      Assessment & Plan:    Eric Baldwin is a 42 y.o. male Depression with anxiety - Plan: sertraline (ZOLOFT) 100 MG  tablet  -Previous nonadherence to medication. Return of depression symptoms. May also have some component of grieving from father's death in 10-18-15.   - Restart Zoloft 100 mg daily, repeat visit in 4-6 weeks to determine if further increase as needed.  -Stressed importance of counseling, at least a few appointments as that may lessen the need for dosing changes.   -Recommended to minimize alcohol intake  -  Recheck 4-6 weeks, sooner if worse  Meds ordered this encounter  Medications  . sertraline (ZOLOFT) 100 MG tablet    Sig: Take 1 tablet (100 mg total) by mouth daily.    Dispense:  90 tablet    Refill:  1   Patient Instructions   Restart Zoloft once per day at 100mg . Counselor numbers below - please call to schedule appointment. Recheck in 6 weeks to decide if changes are needed. Return to the clinic or go to the nearest emergency room if any of your symptoms worsen or new symptoms occur.  Karmen Bongo: [phone] [place]: [phone]   Major Depressive Disorder, Adult Major depressive disorder (MDD) is a mental health condition. MDD often makes you feel sad, hopeless, or helpless. MDD can also cause symptoms in your body. MDD can affect your:  Work.  School.  Relationships.  Other normal activities.  MDD can range from mild to very bad. It may occur once (single episode MDD). It can also occur many times (recurrent MDD). The main symptoms of MDD often include:  Feeling sad, depressed, or irritable most of the time.  Loss of interest.  MDD symptoms also include:  Sleeping too much or too little.  Eating too much or too little.  A change in your weight.  Feeling tired (fatigue) or having low energy.  Feeling worthless.  Feeling guilty.  Trouble making decisions.  Trouble thinking clearly.  Thoughts of suicide or harming others.  Feeling weak.  Feeling agitated.  Keeping yourself from being around other people (isolation).  Follow these instructions  at home: Activity  Do these things as told by your doctor: ? Go back to your normal activities. ? Exercise regularly. ? Spend time outdoors. Alcohol  Talk with your doctor about how alcohol can affect your antidepressant medicines.  Do not drink alcohol. Or, limit how much alcohol you drink. ? This means no more than 1 drink a day for nonpregnant women and 2 drinks a day for men. One drink equals one of these:  12 oz of beer.  5 oz of wine.  1 oz of hard liquor. General instructions  Take over-the-counter and prescription medicines only as told by your doctor.  Eat a healthy diet.  Get plenty of sleep.  Find activities that you enjoy. Make time to do them.  Think about joining a support group. Your doctor may be able to suggest a group for you.  Keep all follow-up visits as told by your doctor. This is important. Where to find more information:  The First American on Mental Illness: ? www.nami.org  U.S. [employer] of Mental Health: ? http://www.maynard.net/  National Suicide Prevention Lifeline: ? 708-412-6103. This is free, 24-hour help. Contact a doctor if:  Your symptoms get worse.  You have new symptoms. Get help right away if:  You self-harm.  You see, hear, taste, smell, or feel things that are not present (hallucinate). If you ever feel like you may hurt yourself or others, or have thoughts about taking your own life, get help right away. You can go to your nearest emergency department or call:  Your local emergency services (911 in the [place]  A suicide crisis helpline, such as the National Suicide Prevention Lifeline: ? 8164299571. This is open 24 hours a day.  This information is not intended to replace advice given to you by your health care provider. Make sure you discuss any questions you have with your health care provider. Document Released: 05/05/2015 Document Revised: 02/08/2016  Document Reviewed: 02/08/2016 Elsevier Interactive  Patient Education  2017 [employer]    IF you received an x-ray today, you will receive an invoice from [facility]. Please contact [facility] at [phone] with questions or concerns regarding your invoice.   IF you received labwork today, you will receive an invoice from [facility]. Please contact [facility] at [phone] with questions or concerns regarding your invoice.   Our billing staff will not be able to assist you with questions regarding bills from these companies.  You will be contacted with the lab results as soon as they are available. The fastest way to get your results is to activate your My Chart account. Instructions are located on the last page of this paperwork. If you have not heard from us regarding the results in 2 weeks, please contact this office.      I personally performed the services described in this documentation, which was scribed in my presence. The recorded information has been reviewed and considered for accuracy and completeness, addended by me as needed, and agree with information above.  Signed,   Meredith StaggersJeffrey Magaline Steinberg, MD Primary Care at [facility].  07/14/17 8:37 AM

## 2017-07-14 NOTE — Addendum Note (Signed)
Addended by: Lendell CapriceHRISTOPHER, Dawson Hollman M on: 07/14/2017 08:45 AM   Modules accepted: Orders

## 2017-08-29 ENCOUNTER — Ambulatory Visit: Payer: BLUE CROSS/BLUE SHIELD | Admitting: Family Medicine

## 2017-08-29 ENCOUNTER — Other Ambulatory Visit: Payer: Self-pay

## 2017-08-29 ENCOUNTER — Encounter: Payer: Self-pay | Admitting: Family Medicine

## 2017-08-29 DIAGNOSIS — F418 Other specified anxiety disorders: Secondary | ICD-10-CM | POA: Diagnosis not present

## 2017-08-29 MED ORDER — SERTRALINE HCL 100 MG PO TABS: 100.0000 mg | ORAL_TABLET | Freq: Every day | ORAL | 1 refills | Status: DC

## 2017-08-29 MED ORDER — SERTRALINE HCL 25 MG PO TABS: 25.0000 mg | ORAL_TABLET | Freq: Every day | ORAL | 1 refills | Status: DC

## 2017-08-29 NOTE — Progress Notes (Signed)
Subjective:  By signing my name below, I, Stann Ore, attest that this documentation has been prepared under the direction and in the presence of Meredith Staggers, MD. Electronically Signed: Stann Ore, Scribe. 08/29/2017 , 11:18 AM .  Patient was seen in Room 10 .   Patient ID: Eric Baldwin, male    DOB: Jul 10, 1975, 42 y.o.   MRN: [id number] Chief Complaint  Patient presents with  . Depression    follow up    HPI Eric Baldwin is a 42 y.o. male  Here for follow up of depression. Last seen on Feb 7th, at that time, he hasn't been seen since April 2017. He had been off of Zoloft since July 2018. He was having some decreased drive and anhedonia, and had persistent guilt with his father passing in Nov 2017. We had discussed meeting with a counselor and names were provided when he was ready. Zoloft was restarted at  QD at last visit. We had also discussed on minimalizing use of alcohol.   Patient is currently on Zoloft  QD. He states he's doing alright, and feeling happier. He hasn't really gained his weight back yet, but it's coming along as his appetite has improved. He's noticed wanting extra snacks between meals. He usually eats about 2 meals a day, and misses breakfast. His first meal is around 11:00PM and then next meal around 4:00PM, situated around his work. Within a day, he sleeps about 6 hours. He denies any SI/Hi or suicidal ideations. He denies any alcohol use recently; bought a bottle of Absolut Vodka about a month ago, and it took about a week and a half to finish it. He hasn't talked to a counselor yet, but has been talking to his niece just to talk to someone about it with some benefit. He states he enjoys going to work, because he can deal with the people.   His work schedule is:  [employer] - around 5:00PM to 10:00PM.  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   Patient Active Problem List   Diagnosis Date Noted  . Screen for STD (sexually transmitted disease) 10/26/2016   . Dysuria 10/26/2016   History reviewed. No pertinent past medical history. History reviewed. No pertinent surgical history. No Known Allergies Prior to Admission medications   Medication Sig Start Date End Date Taking? Authorizing Provider  sertraline (ZOLOFT) 100 MG tablet Take 1 tablet (100 mg total) by mouth daily. 07/14/17   Shade Flood, MD   Social History   Socioeconomic History  . Marital status: Single    Spouse name: Not on file  . Number of children: Not on file  . Years of education: Not on file  . Highest education level: Not on file  Occupational History  . Not on file  Social Needs  . Financial resource strain: Not on file  . Food insecurity:    Worry: Not on file    Inability: Not on file  . Transportation needs:    Medical: Not on file    Non-medical: Not on file  Tobacco Use  . Smoking status: Former Smoker    Types: E-cigarettes  . Smokeless tobacco: Never Used  Substance and Sexual Activity  . Alcohol use: Yes    Alcohol/week: 6.0 oz    Types: 5 Cans of beer, 5 Shots of liquor per week  . Drug use: No  . Sexual activity: Not on file  Lifestyle  . Physical activity:    Days per week: Not on file  Minutes per session: Not on file  . Stress: Not on file  Relationships  . Social connections:    Talks on phone: Not on file    Gets together: Not on file    Attends religious service: Not on file    Active member of club or organization: Not on file    Attends meetings of clubs or organizations: Not on file    Relationship status: Not on file  . Intimate partner violence:    Fear of current or ex partner: Not on file    Emotionally abused: Not on file    Physically abused: Not on file    Forced sexual activity: Not on file  Other Topics Concern  . Not on file  Social History Narrative  . Not on file   Review of Systems  Constitutional: Negative for fatigue and unexpected weight change.  Eyes: Negative for visual disturbance.    Respiratory: Negative for cough, chest tightness and shortness of breath.   Cardiovascular: Negative for chest pain, palpitations and leg swelling.  Gastrointestinal: Negative for abdominal pain and blood in stool.  Neurological: Negative for dizziness, light-headedness and headaches.       Objective:   Physical Exam  Constitutional: He is oriented to person, place, and time. He appears well-developed and well-nourished. No distress.  HENT:  Head: Normocephalic and atraumatic.  Eyes: Pupils are equal, round, and reactive to light. EOM are normal.  Neck: Neck supple.  Cardiovascular: Normal rate.  Pulmonary/Chest: Effort normal. No respiratory distress.  Musculoskeletal: Normal range of motion.  Neurological: He is alert and oriented to person, place, and time.  Skin: Skin is warm and dry.  Psychiatric: He has a normal mood and affect. His behavior is normal.  Nursing note and vitals reviewed.   Vitals:   08/29/17 1054  BP: 122/72  Pulse: 84  Resp: 18  Temp: 98.5 F (36.9 C)  TempSrc: Oral  SpO2: 98%  Weight: 164 lb 9.6 oz (74.7 kg)  Height: 5' 10.35" (1.787 m)   Wt Readings from Last 3 Encounters:  08/29/17 164 lb 9.6 oz (74.7 kg)  07/14/17 165 lb 12.8 oz (75.2 kg)  01/11/17 179 lb 12.8 oz (81.6 kg)       Assessment & Plan:   Eric Baldwin is a 42 y.o. male Depression with anxiety - Plan: sertraline (ZOLOFT) 100 MG tablet, sertraline (ZOLOFT) 25 MG tablet  -Improved, but still some anhedonia.  Will try to increase to 125 mg Zoloft per day which he had taken previously.  3 meals per day discussed with snacks in between, then monitor for weight changes.  Some decreased weight may be due to decreased appetite/insufficient calories compared to his workload.  -Option of counseling discussed again, but not interested at this time.  -Recheck in 3 months, but advised him to update me based on his symptoms in the next 4-6 weeks at the higher dose of Zoloft.  RTC precautions  if worsening   Meds ordered this encounter  Medications  . sertraline (ZOLOFT) 100 MG tablet    Sig: Take 1 tablet (100 mg total) by mouth daily.    Dispense:  90 tablet    Refill:  1  . sertraline (ZOLOFT) 25 MG tablet    Sig: Take 1 tablet (25 mg total) by mouth daily.    Dispense:  90 tablet    Refill:  1   Patient Instructions    Try to eat 3 meals per day, with snacks in between  to help with calorie needs.   We can try  once per day of Zoloft, but let me know how that is doing in the next 4-6 weeks. Follow up in 3 months.   Return to the clinic or go to the nearest emergency room if any of your symptoms worsen or new symptoms occur.   IF you received an x-ray today, you will receive an invoice from [facility]. Please contact [facility] at [phone] with questions or concerns regarding your invoice.   IF you received labwork today, you will receive an invoice from [facility]. Please contact [facility] at [phone] with questions or concerns regarding your invoice.   Our billing staff will not be able to assist you with questions regarding bills from these companies.  You will be contacted with the lab results as soon as they are available. The fastest way to get your results is to activate your My Chart account. Instructions are located on the last page of this paperwork. If you have not heard from [place] regarding the results in 2 weeks, please contact this office.      I personally performed the services described in this documentation, which was scribed in my presence. The recorded information has been reviewed and considered for accuracy and completeness, addended by me as needed, and agree with information above.  Signed,   Meredith Staggers, MD Primary Care at [facility].  08/29/17 11:20 AM

## 2017-08-29 NOTE — Patient Instructions (Addendum)
  Try to eat 3 meals per day, with snacks in between to help with calorie needs.   We can try 125mg  once per day of Zoloft, but let me know how that is doing in the next 4-6 weeks. Follow up in 3 months.   Return to the clinic or go to the nearest emergency room if any of your symptoms worsen or new symptoms occur.   IF you received an x-ray today, you will receive an invoice from [facility]. Please contact [facility] at [phone] with questions or concerns regarding your invoice.   IF you received labwork today, you will receive an invoice from [facility]. Please contact [facility] at [phone] with questions or concerns regarding your invoice.   Our billing staff will not be able to assist you with questions regarding bills from these companies.  You will be contacted with the lab results as soon as they are available. The fastest way to get your results is to activate your My Chart account. Instructions are located on the last page of this paperwork. If you have not heard from us regarding the results in 2 weeks, please contact this office.

## 2017-11-29 ENCOUNTER — Ambulatory Visit: Payer: BLUE CROSS/BLUE SHIELD | Admitting: Family Medicine

## 2018-02-08 ENCOUNTER — Emergency Department (HOSPITAL_COMMUNITY)
Admission: EM | Admit: 2018-02-08 | Discharge: 2018-02-09 | Disposition: A | Discharge status: Other Acute Inpatient Hospital | Payer: BLUE CROSS/BLUE SHIELD | Attending: Emergency Medicine | Admitting: Emergency Medicine

## 2018-02-08 ENCOUNTER — Encounter (HOSPITAL_COMMUNITY): Payer: Self-pay

## 2018-02-08 DIAGNOSIS — F419 Anxiety disorder, unspecified: Secondary | ICD-10-CM | POA: Diagnosis not present

## 2018-02-08 DIAGNOSIS — Z79899 Other long term (current) drug therapy: Secondary | ICD-10-CM | POA: Diagnosis not present

## 2018-02-08 DIAGNOSIS — F29 Unspecified psychosis not due to a substance or known physiological condition: Secondary | ICD-10-CM

## 2018-02-08 DIAGNOSIS — F17200 Nicotine dependence, unspecified, uncomplicated: Secondary | ICD-10-CM | POA: Diagnosis not present

## 2018-02-08 DIAGNOSIS — E876 Hypokalemia: Secondary | ICD-10-CM | POA: Diagnosis not present

## 2018-02-08 DIAGNOSIS — R462 Strange and inexplicable behavior: Secondary | ICD-10-CM | POA: Diagnosis not present

## 2018-02-08 DIAGNOSIS — Z87891 Personal history of nicotine dependence: Secondary | ICD-10-CM | POA: Diagnosis not present

## 2018-02-08 DIAGNOSIS — F919 Conduct disorder, unspecified: Secondary | ICD-10-CM | POA: Diagnosis present

## 2018-02-08 LAB — COMPREHENSIVE METABOLIC PANEL
ALBUMIN: 4.6 g/dL (ref 3.5–5.0)
ALK PHOS: 43 U/L (ref 38–126)
ALT: 21 U/L (ref 0–44)
ANION GAP: 11 (ref 5–15)
AST: 27 U/L (ref 15–41)
BILIRUBIN TOTAL: 1.1 mg/dL (ref 0.3–1.2)
BUN: 16 mg/dL (ref 6–20)
CALCIUM: 9.1 mg/dL (ref 8.9–10.3)
CO2: 23 mmol/L (ref 22–32)
Chloride: 109 mmol/L (ref 98–111)
Creatinine, Ser: 0.93 mg/dL (ref 0.61–1.24)
GLUCOSE: 139 mg/dL — AB (ref 70–99)
Potassium: 2.9 mmol/L — ABNORMAL LOW (ref 3.5–5.1)
Sodium: 143 mmol/L (ref 135–145)
TOTAL PROTEIN: 7.8 g/dL (ref 6.5–8.1)

## 2018-02-08 LAB — CBC WITH DIFFERENTIAL/PLATELET
Basophils Absolute: 0 10*3/uL (ref 0.0–0.1)
Basophils Relative: 0 %
Eosinophils Absolute: 0 10*3/uL (ref 0.0–0.7)
Eosinophils Relative: 0 %
HEMATOCRIT: 41.2 % (ref 39.0–52.0)
HEMOGLOBIN: 13.9 g/dL (ref 13.0–17.0)
LYMPHS ABS: 1.4 10*3/uL (ref 0.7–4.0)
LYMPHS PCT: 23 %
MCH: 29.5 pg (ref 26.0–34.0)
MCHC: 33.7 g/dL (ref 30.0–36.0)
MCV: 87.5 fL (ref 78.0–100.0)
MONO ABS: 0.6 10*3/uL (ref 0.1–1.0)
MONOS PCT: 10 %
NEUTROS ABS: 4.2 10*3/uL (ref 1.7–7.7)
NEUTROS PCT: 67 %
Platelets: 296 10*3/uL (ref 150–400)
RBC: 4.71 MIL/uL (ref 4.22–5.81)
RDW: 13.7 % (ref 11.5–15.5)
WBC: 6.3 10*3/uL (ref 4.0–10.5)

## 2018-02-08 LAB — ETHANOL

## 2018-02-08 LAB — RAPID URINE DRUG SCREEN, HOSP PERFORMED
Amphetamines: NOT DETECTED
Barbiturates: NOT DETECTED
Benzodiazepines: POSITIVE — AB
COCAINE: NOT DETECTED
OPIATES: NOT DETECTED
Tetrahydrocannabinol: POSITIVE — AB

## 2018-02-08 MED ORDER — QUETIAPINE FUMARATE 50 MG PO TABS
50.0000 mg | ORAL_TABLET | Freq: Every day | ORAL | Status: DC
  Administered 2018-02-08: 50 mg via ORAL
  Filled 2018-02-08: qty 1

## 2018-02-08 MED ORDER — TRAZODONE HCL 100 MG PO TABS
100.0000 mg | ORAL_TABLET | Freq: Every day | ORAL | Status: DC
  Administered 2018-02-08: 100 mg via ORAL
  Filled 2018-02-08: qty 1

## 2018-02-08 MED ORDER — POTASSIUM CHLORIDE CRYS ER 20 MEQ PO TBCR
40.0000 meq | EXTENDED_RELEASE_TABLET | Freq: Once | ORAL | Status: AC
  Administered 2018-02-09: 40 meq via ORAL
  Filled 2018-02-08: qty 2

## 2018-02-08 MED ORDER — HYDROXYZINE HCL 25 MG PO TABS: 25.0000 mg | ORAL_TABLET | Freq: Three times a day (TID) | ORAL | Status: DC | PRN

## 2018-02-08 MED ORDER — ZIPRASIDONE MESYLATE 20 MG IM SOLR
20.0000 mg | Freq: Once | INTRAMUSCULAR | Status: AC
  Administered 2018-02-08: 20 mg via INTRAMUSCULAR
  Filled 2018-02-08: qty 20

## 2018-02-08 MED ORDER — STERILE WATER FOR INJECTION IJ SOLN
INTRAMUSCULAR | Status: AC
  Administered 2018-02-08: 07:00:00
  Filled 2018-02-08: qty 10

## 2018-02-08 MED ORDER — SERTRALINE HCL 50 MG PO TABS: 50.0000 mg | ORAL_TABLET | Freq: Every day | ORAL | Status: DC

## 2018-02-08 NOTE — BH Assessment (Signed)
Assessment Note  Eric Baldwin is an 42 y.o. male. Pt denies SI/HI and AVH. Pt denies previous SI attempts. Pt appears to be manic. Pt was poor historian. Pt had difficulty answering the questions in the assessment. The Pt was hyper-religious. The Pt's speech was tangential and had flight of ideas. Pt denies previous inpatient treatment. Pt states he is receiving depression medication from his PCP. Pt reports marijuana use.   Jacki Cones, NP recommends inpatient treatment.   Diagnosis:   F06.2 Psychotic disorder  Past Medical History: History reviewed. No pertinent past medical history.  History reviewed. No pertinent surgical history.  Family History:  Family History  Problem Relation Age of Onset  . Heart disease Father     Social History:  reports that he has quit smoking. His smoking use included e-cigarettes. He has never used smokeless tobacco. He reports that he drinks about 10.0 standard drinks of alcohol per week. He reports that he does not use drugs.  Additional Social History:  Alcohol / Drug Use Pain Medications: please see mar Prescriptions: please see mar Over the Counter: please see mar History of alcohol / drug use?: Yes Longest period of sobriety (when/how long): unknown Substance #1 Name of Substance 1: benzos 1 - Age of First Use: unknown 1 - Amount (size/oz): unknown 1 - Frequency: unknown 1 - Duration: unknown 1 - Last Use / Amount: unknown Substance #2 Name of Substance 2: marijuana 2 - Age of First Use: unknown 2 - Amount (size/oz): unknown 2 - Frequency: unknown 2 - Duration: unknown 2 - Last Use / Amount: unknown  CIWA: CIWA-Ar BP: 118/77 Pulse Rate: 91 COWS:    Allergies:  Allergies  Allergen Reactions  . Mango Flavor Rash    Home Medications:  (Not in a hospital admission)  OB/GYN Status:  No LMP for male patient.  General Assessment Data [facility] of Assessment: [facility] TTS Assessment: In system Is this a Tele or Face-to-Face  Assessment?: Face-to-Face Is this an Initial Assessment or a Re-assessment for this encounter?: Initial Assessment Patient Accompanied by:: N/A Language Other than English: No Living Arrangements: Homeless/Shelter What gender do you identify as?: Male Marital status: Single Maiden name: NA Pregnancy Status: No Living Arrangements: Other (Comment)(currently homeless) Can pt return to current living arrangement?: No Admission Status: Voluntary Is patient capable of signing voluntary admission?: Yes Referral Source: Self/Family/Friend Insurance type: SP     Crisis Care Plan Living Arrangements: Other (Comment)(currently homeless) Legal Guardian: Other:(self) Name of Psychiatrist: NA Name of Therapist: NA  Education Status Is patient currently in school?: No Is the patient employed, unemployed or receiving disability?: Employed  Risk to self with the past 6 months Suicidal Ideation: No Has patient been a risk to self within the past 6 months prior to admission? : No Suicidal Intent: No Has patient had any suicidal intent within the past 6 months prior to admission? : No Is patient at risk for suicide?: No Suicidal Plan?: No Has patient had any suicidal plan within the past 6 months prior to admission? : No Access to Means: No What has been your use of drugs/alcohol within the last 12 months?: NA Previous Attempts/Gestures: No How many times?: 0 Other Self Harm Risks: NA Triggers for Past Attempts: None known Intentional Self Injurious Behavior: None Family Suicide History: No Recent stressful life event(s): Other (Comment)(states he was kicked out of his home) Persecutory voices/beliefs?: No Depression: No Depression Symptoms: (pt denies) Substance abuse history and/or treatment for substance abuse?: Yes Suicide prevention  information given to non-admitted patients: Not applicable  Risk to Others within the past 6 months Homicidal Ideation: No Does patient have any  lifetime risk of violence toward others beyond the six months prior to admission? : No Thoughts of Harm to Others: No Current Homicidal Intent: No Current Homicidal Plan: No Access to Homicidal Means: No Identified Victim: NA History of harm to others?: No Assessment of Violence: None Noted Violent Behavior Description: NA Does patient have access to weapons?: No Criminal Charges Pending?: No Does patient have a court date: No Is patient on probation?: No  Psychosis Hallucinations: None noted Delusions: None noted  Mental Status Report Appearance/Hygiene: Unremarkable Eye Contact: Fair Motor Activity: Agitation Speech: Tangential Level of Consciousness: Alert Mood: Anxious, Irritable Affect: Anxious Anxiety Level: Moderate Thought Processes: Tangential, Flight of Ideas Judgement: Impaired Orientation: Place Obsessive Compulsive Thoughts/Behaviors: None  Cognitive Functioning Concentration: Decreased Memory: Recent Impaired, Remote Impaired Is patient IDD: No Insight: Poor Impulse Control: Poor Appetite: Fair Have you had any weight changes? : No Change Sleep: No Change Total Hours of Sleep: 8 Vegetative Symptoms: None  ADLScreening [facility] Assessment Services) Patient's cognitive ability adequate to safely complete daily activities?: Yes Patient able to express need for assistance with ADLs?: Yes Independently performs ADLs?: Yes (appropriate for developmental age)  Prior Inpatient Therapy Prior Inpatient Therapy: No  Prior Outpatient Therapy Prior Outpatient Therapy: Yes Prior Therapy Dates: current Prior Therapy Facilty/Provider(s): PCP Reason for Treatment: depression Does patient have an ACCT team?: No Does patient have Intensive In-House Services?  : No Does patient have [facility] services? : No Does patient have P4CC services?: No  ADL Screening (condition at time of admission) Patient's cognitive ability adequate to safely complete daily activities?:  Yes Is the patient deaf or have difficulty hearing?: No Does the patient have difficulty seeing, even when wearing glasses/contacts?: No Does the patient have difficulty concentrating, remembering, or making decisions?: No Patient able to express need for assistance with ADLs?: Yes Does the patient have difficulty dressing or bathing?: No Independently performs ADLs?: Yes (appropriate for developmental age)       Abuse/Neglect Assessment (Assessment to be complete while patient is alone) Abuse/Neglect Assessment Can Be Completed: Yes Physical Abuse: Denies Verbal Abuse: Denies Sexual Abuse: Denies Exploitation of patient/patient's resources: Denies     Merchant navy officer (For Healthcare) Does Patient Have a Medical Advance Directive?: No Would patient like information on creating a medical advance directive?: No - Patient declined          Disposition:  Disposition Initial Assessment Completed for this Encounter: Yes Disposition of Patient: Admit Type of inpatient treatment program: Adult  On Site Evaluation by:   Reviewed with Physician:    Emmit Pomfret 02/08/2018 6:25 PM

## 2018-02-08 NOTE — ED Provider Notes (Signed)
[facility]-EMERGENCY DEPT Provider Note   CSN: [id number] Arrival date & time: 02/08/18  0556     History   Chief Complaint Chief Complaint  Patient presents with  . Manic Behavior    HPI Eric Baldwin is a 42 y.o. male.  HPI 42 year old male comes in with chief complaint of disruptive behavior. Patient brought here by EMS.  [place] were [place] in after patient was noted to be behaving erratically at his work.  Patient has been difficult to control, he has been making nonsensical and incoherent references, and repeatedly bringing up "God and Infinity".  Level 5 caveat for psychiatric condition.  History reviewed. No pertinent past medical history.  Patient Active Problem List   Diagnosis Date Noted  . Screen for STD (sexually transmitted disease) 10/26/2016  . Dysuria 10/26/2016    History reviewed. No pertinent surgical history.      Home Medications    Prior to Admission medications   Medication Sig Start Date End Date Taking? Authorizing Provider  sertraline (ZOLOFT) 100 MG tablet Take 1 tablet (100 mg total) by mouth daily. 08/29/17   Shade Flood, MD  sertraline (ZOLOFT) 25 MG tablet Take 1 tablet (25 mg total) by mouth daily. 08/29/17   Shade Flood, MD    Family History Family History  Problem Relation Age of Onset  . Heart disease Father     Social History Social History   Tobacco Use  . Smoking status: Former Smoker    Types: E-cigarettes  . Smokeless tobacco: Never Used  Substance Use Topics  . Alcohol use: Yes    Alcohol/week: 10.0 standard drinks    Types: 5 Cans of beer, 5 Shots of liquor per week  . Drug use: No     Allergies   Patient has no known allergies.   Review of Systems Review of Systems  Unable to perform ROS: Psychiatric disorder     Physical Exam Updated Vital Signs BP 108/83   Pulse 82   Resp 18   Ht 5\' 10"  (1.778 m)   Wt 79.4  kg   SpO2 98%   BMI 25.11 kg/m   Physical Exam  Constitutional: He is oriented to person, place, and time. He appears well-developed.  HENT:  Head: Atraumatic.  Neck: Neck supple.  Cardiovascular: Normal rate.  Pulmonary/Chest: Effort normal.  Neurological: He is alert and oriented to person, place, and time.  Skin: Skin is warm.  Psychiatric:  Tangential thinking, flight of ideas, inattention.  Nursing note and vitals reviewed.    ED Treatments / Results  Labs (all labs ordered are listed, but only abnormal results are displayed) Labs Reviewed  COMPREHENSIVE METABOLIC PANEL - Abnormal; Notable for the following components:      Result Value   Potassium 2.9 (*)    Glucose, Bld 139 (*)    All other components within normal limits  ETHANOL  RAPID URINE DRUG SCREEN, HOSP PERFORMED  CBC WITH DIFFERENTIAL/PLATELET    EKG EKG Interpretation  Date/Time:  Wednesday February 08 2018 06:04:19 EDT Ventricular Rate:  110 PR Interval:    QRS Duration: 84 QT Interval:  322 QTC Calculation: 436 R Axis:   70 Text Interpretation:  Sinus tachycardia Ventricular premature complex Aberrant complex LAE, consider biatrial enlargement Minimal ST depression, diffuse leads Nonspecific ST abnormality No old tracing to compare Confirmed by Derwood Kaplan [id number]) on 02/08/2018 7:31:00 AM   Radiology No results found.  Procedures  Procedures (including critical care time)  Medications Ordered in ED Medications  potassium chloride SA (K-DUR,KLOR-CON) CR tablet 40 mEq (has no administration in time range)  ziprasidone (GEODON) injection 20 mg (20 mg Intramuscular Given 02/08/18 0616)  sterile water (preservative free) injection (  Given 02/08/18 0723)     Initial Impression / Assessment and Plan / ED Course  I have reviewed the triage vital signs and the nursing notes.  Pertinent labs & imaging results that were available during my care of the patient were reviewed by me and considered in  my medical decision making (see chart for details).     42 year old comes in with chief complaint of abnormal behavior.  Patient is noted to be delusional, delirious. He has been medically cleared for psychiatric evaluation. IVC paperwork completed. Restraints put for patient safety.  Final Clinical Impressions(s) / ED Diagnoses   Final diagnoses:  Psychosis, unspecified psychosis type [facility])    ED Discharge Orders    None       Derwood Kaplan, MD 02/08/18 [phone]

## 2018-02-08 NOTE — ED Notes (Signed)
Order for Sitter at bedside placed. Consulting civil engineer notified.

## 2018-02-08 NOTE — ED Notes (Signed)
Bed: BV67 Expected date:  Expected time:  Means of arrival:  Comments: 42 yr old male, delirium

## 2018-02-08 NOTE — ED Notes (Signed)
Pt paranoid, reports his boss is in his business.  Pt religiously preoccupied.  Cooperative and anxious.  Pt is IVC  A&O x 3, no distress noted.  Pt reports he has 2 jobs, at [employer] and [employer].  Pt reports family kicked him out of their apartment.  Pt has belongings in his car at work.  Monitoring for safety, Q 15 min checks in effect.

## 2018-02-08 NOTE — ED Notes (Signed)
Pt arrived in handcuffs from [place] [employer]. Provider at bedside. Pt placed in restraints for safety. CMS intact.

## 2018-02-08 NOTE — ED Notes (Signed)
TTS at bedside with patient.   

## 2018-02-08 NOTE — ED Triage Notes (Signed)
Pt [place] ED [place] the airport. Pt works there, and started acting strange around 40. PT was asked to leave, but started getting aggressive. EMS and PD called. Pt incoherent and yelling about God and infinity. PT unable to answer any questions at this time. Unknown ETOH or drug use.

## 2018-02-09 ENCOUNTER — Encounter (HOSPITAL_COMMUNITY): Payer: Self-pay

## 2018-02-09 ENCOUNTER — Inpatient Hospital Stay (HOSPITAL_COMMUNITY)
Admission: AD | Admit: 2018-02-09 | Discharge: 2018-02-13 | DRG: 885 | Disposition: A | Discharge status: Home / Self Care | Payer: BLUE CROSS/BLUE SHIELD | Source: Intra-hospital | Attending: Psychiatry | Admitting: Psychiatry

## 2018-02-09 ENCOUNTER — Other Ambulatory Visit: Payer: Self-pay

## 2018-02-09 DIAGNOSIS — F419 Anxiety disorder, unspecified: Secondary | ICD-10-CM

## 2018-02-09 DIAGNOSIS — R462 Strange and inexplicable behavior: Secondary | ICD-10-CM | POA: Diagnosis not present

## 2018-02-09 DIAGNOSIS — Z62810 Personal history of physical and sexual abuse in childhood: Secondary | ICD-10-CM | POA: Diagnosis present

## 2018-02-09 DIAGNOSIS — Z87891 Personal history of nicotine dependence: Secondary | ICD-10-CM

## 2018-02-09 DIAGNOSIS — F29 Unspecified psychosis not due to a substance or known physiological condition: Secondary | ICD-10-CM | POA: Diagnosis present

## 2018-02-09 DIAGNOSIS — F332 Major depressive disorder, recurrent severe without psychotic features: Secondary | ICD-10-CM | POA: Diagnosis present

## 2018-02-09 DIAGNOSIS — Z79899 Other long term (current) drug therapy: Secondary | ICD-10-CM

## 2018-02-09 DIAGNOSIS — F418 Other specified anxiety disorders: Secondary | ICD-10-CM

## 2018-02-09 DIAGNOSIS — F1290 Cannabis use, unspecified, uncomplicated: Secondary | ICD-10-CM | POA: Diagnosis present

## 2018-02-09 DIAGNOSIS — G4700 Insomnia, unspecified: Secondary | ICD-10-CM | POA: Diagnosis present

## 2018-02-09 DIAGNOSIS — F4310 Post-traumatic stress disorder, unspecified: Secondary | ICD-10-CM | POA: Diagnosis present

## 2018-02-09 DIAGNOSIS — Z91018 Allergy to other foods: Secondary | ICD-10-CM | POA: Diagnosis not present

## 2018-02-09 DIAGNOSIS — Z23 Encounter for immunization: Secondary | ICD-10-CM

## 2018-02-09 MED ORDER — QUETIAPINE FUMARATE 50 MG PO TABS
50.0000 mg | ORAL_TABLET | Freq: Two times a day (BID) | ORAL | Status: DC
  Administered 2018-02-09: 50 mg via ORAL
  Filled 2018-02-09: qty 1

## 2018-02-09 MED ORDER — HYDROXYZINE HCL 25 MG PO TABS: 25.0000 mg | ORAL_TABLET | Freq: Three times a day (TID) | ORAL | Status: DC | PRN

## 2018-02-09 MED ORDER — QUETIAPINE FUMARATE 50 MG PO TABS
50.0000 mg | ORAL_TABLET | Freq: Two times a day (BID) | ORAL | Status: DC
  Administered 2018-02-09 – 2018-02-10 (×2): 50 mg via ORAL
  Filled 2018-02-09 (×8): qty 1

## 2018-02-09 MED ORDER — ACETAMINOPHEN 325 MG PO TABS
650.0000 mg | ORAL_TABLET | Freq: Four times a day (QID) | ORAL | Status: DC | PRN
  Administered 2018-02-09: 650 mg via ORAL
  Filled 2018-02-09: qty 2

## 2018-02-09 MED ORDER — INFLUENZA VAC SPLIT QUAD 0.5 ML IM SUSY
0.5000 mL | PREFILLED_SYRINGE | INTRAMUSCULAR | Status: AC
  Administered 2018-02-10: 0.5 mL via INTRAMUSCULAR
  Filled 2018-02-09: qty 0.5

## 2018-02-09 MED ORDER — TRAZODONE HCL 50 MG PO TABS: 50.0000 mg | ORAL_TABLET | Freq: Every evening | ORAL | Status: DC | PRN

## 2018-02-09 MED ORDER — PNEUMOCOCCAL VAC POLYVALENT 25 MCG/0.5ML IJ INJ
0.5000 mL | INJECTION | INTRAMUSCULAR | Status: AC
  Administered 2018-02-10: 0.5 mL via INTRAMUSCULAR

## 2018-02-09 MED ORDER — TRAZODONE HCL 100 MG PO TABS
100.0000 mg | ORAL_TABLET | Freq: Every day | ORAL | Status: DC
  Administered 2018-02-09: 100 mg via ORAL
  Filled 2018-02-09 (×4): qty 1

## 2018-02-09 MED ORDER — ALUM & MAG HYDROXIDE-SIMETH 200-200-20 MG/5ML PO SUSP: 30.0000 mL | ORAL | Status: DC | PRN

## 2018-02-09 MED ORDER — MAGNESIUM HYDROXIDE 400 MG/5ML PO SUSP: 30.0000 mL | Freq: Every day | ORAL | Status: DC | PRN

## 2018-02-09 MED ORDER — HYDROXYZINE HCL 25 MG PO TABS
25.0000 mg | ORAL_TABLET | Freq: Three times a day (TID) | ORAL | Status: DC | PRN
  Administered 2018-02-09: 25 mg via ORAL
  Filled 2018-02-09: qty 1

## 2018-02-09 NOTE — BH Assessment (Signed)
[facility] Assessment Progress Note  Per Nelly Rout, MD, this pt requires psychiatric hospitalization.  Berneice Heinrich, RN, [facility] has assigned pt to [facility] Rm 500-2.  Pt presents under IVC initiated by EDP Derwood Kaplan, MD, and IVC documents have been faxed to [facility].  Pt's nurse, Diane, has been notified, and agrees to call report to [phone].  Pt is to be transported via [employer].   Doylene Canning, [place] [facility] Coordinator [phone]

## 2018-02-09 NOTE — Progress Notes (Signed)
Patient states that he had an "enlightened day". He explained that he learned a few things but did not go into further detail. His goal for tomorrow is to "try to do better".

## 2018-02-09 NOTE — ED Notes (Signed)
Report given to [facility] RN at [facility] and [employer] called for transport.  Pt calm and cooperative.

## 2018-02-09 NOTE — ED Notes (Signed)
Pt transported to [facility] by [employer]. All belongings returned to pt who signed for same. Pt was calm and cooperative.  

## 2018-02-09 NOTE — Consult Note (Signed)
[name] Psychiatry Consult   Reason for Consult:   Referring Physician:  EDP Patient Identification: Eric Baldwin MRN:  [id number] Principal Diagnosis: <principal problem not specified> Diagnosis:   Patient Active Problem List   Diagnosis Date Noted  . Screen for STD (sexually transmitted disease) [Z11.3] 10/26/2016  . Dysuria [R30.0] 10/26/2016    Total Time spent with patient: 45 minutes  Subjective:   Eric Baldwin is a 42 y.o. male patient admitted with bizarre behavior while at work.  HPI:  Pt was seen and chart reviewed with treatment team and Dr Dwyane Dee.  Pt denies suicidal/homicidal ideation, denies auditory/visual hallucinations and does not appear to be responding to internal stimuli. Pt presents as disorganized, paranoid and with pressured speech. Pt was at work when it was noticed he was acting very bizarre, [employer] was called and pt was placed under IVC and hospitalized for psych evaluation. Pt is hyper-religous and states that he is happier since he founf God. Pt's UDS positive for benzos and THC, BAL negative. Pt is homeless and has been living in his car since his niece told him to leave her home.  Pt was started on Seroquel 50 mg BID and will be referred for inpatient psychiatric hospitalization for crisi stabilization and medication management.   Past Psychiatric History: As above  Risk to Self: Suicidal Ideation: No Suicidal Intent: No Is patient at risk for suicide?: No Suicidal Plan?: No Access to Means: No What has been your use of drugs/alcohol within the last 12 months?: NA How many times?: 0 Other Self Harm Risks: NA Triggers for Past Attempts: None known Intentional Self Injurious Behavior: None Risk to Others: Homicidal Ideation: No Thoughts of Harm to Others: No Current Homicidal Intent: No Current Homicidal Plan: No Access to Homicidal Means: No Identified Victim: NA History of harm to others?: No Assessment of Violence: None Noted Violent  Behavior Description: NA Does patient have access to weapons?: No Criminal Charges Pending?: No Does patient have a court date: No Prior Inpatient Therapy: Prior Inpatient Therapy: No Prior Outpatient Therapy: Prior Outpatient Therapy: Yes Prior Therapy Dates: current Prior Therapy Facilty/Provider(s): PCP Reason for Treatment: depression Does patient have an ACCT team?: No Does patient have Intensive In-House Services?  : No Does patient have [facility] services? : No Does patient have P4CC services?: No  Past Medical History: History reviewed. No pertinent past medical history. History reviewed. No pertinent surgical history. Family History:  Family History  Problem Relation Age of Onset  . Heart disease Father    Family Psychiatric  History: Unknown Social History:  Social History   Substance and Sexual Activity  Alcohol Use Yes  . Alcohol/week: 10.0 standard drinks  . Types: 5 Cans of beer, 5 Shots of liquor per week     Social History   Substance and Sexual Activity  Drug Use No    Social History   Socioeconomic History  . Marital status: Single    Spouse name: Not on file  . Number of children: Not on file  . Years of education: Not on file  . Highest education level: Not on file  Occupational History  . Not on file  Social Needs  . Financial resource strain: Not on file  . Food insecurity:    Worry: Not on file    Inability: Not on file  . Transportation needs:    Medical: Not on file    Non-medical: Not on file  Tobacco Use  . Smoking status: Former Smoker  Types: E-cigarettes  . Smokeless tobacco: Never Used  Substance and Sexual Activity  . Alcohol use: Yes    Alcohol/week: 10.0 standard drinks    Types: 5 Cans of beer, 5 Shots of liquor per week  . Drug use: No  . Sexual activity: Not on file  Lifestyle  . Physical activity:    Days per week: Not on file    Minutes per session: Not on file  . Stress: Not on file  Relationships  . Social  connections:    Talks on phone: Not on file    Gets together: Not on file    Attends religious service: Not on file    Active member of club or organization: Not on file    Attends meetings of clubs or organizations: Not on file    Relationship status: Not on file  Other Topics Concern  . Not on file  Social History Narrative  . Not on file   Additional Social History:    Allergies:   Allergies  Allergen Reactions  . Mango Flavor Rash    Labs:  Results for orders placed or performed during the hospital encounter of 02/08/18 (from the past 48 hour(s))  Comprehensive metabolic panel     Status: Abnormal   Collection Time: 02/08/18  6:40 AM  Result Value Ref Range   Sodium 143 135 - 145 mmol/L   Potassium 2.9 (L) 3.5 - 5.1 mmol/L   Chloride 109 98 - 111 mmol/L   CO2 23 22 - 32 mmol/L   Glucose, Bld 139 (H) 70 - 99 mg/dL   BUN 16 6 - 20 mg/dL   Creatinine, Ser 0.93 0.61 - 1.24 mg/dL   Calcium 9.1 8.9 - 10.3 mg/dL   Total Protein 7.8 6.5 - 8.1 g/dL   Albumin 4.6 3.5 - 5.0 g/dL   AST 27 15 - 41 U/L   ALT 21 0 - 44 U/L   Alkaline Phosphatase 43 38 - 126 U/L   Total Bilirubin 1.1 0.3 - 1.2 mg/dL   GFR calc non Af Amer >60 >60 mL/min   GFR calc Af Amer >60 >60 mL/min    Comment: (NOTE) The eGFR has been calculated using the CKD EPI equation. This calculation has not been validated in all clinical situations. eGFR's persistently <60 mL/min signify possible Chronic Kidney Disease.    Anion gap 11 5 - 15    Comment: Performed at [facility], [place] [address], [place], [place] [address]  Ethanol     Status: None   Collection Time: 02/08/18  6:40 AM  Result Value Ref Range   Alcohol, Ethyl (B) <10 <10 mg/dL    Comment: (NOTE) Lowest detectable limit for serum alcohol is 10 mg/dL. For medical purposes only. Performed at [facility], [place] [address], [place], [place] [address]   CBC with Diff     Status: None   Collection Time:  02/08/18  6:40 AM  Result Value Ref Range   WBC 6.3 4.0 - 10.5 K/uL   RBC 4.71 4.22 - 5.81 MIL/uL   Hemoglobin 13.9 13.0 - 17.0 g/dL   HCT 41.2 39.0 - 52.0 %   MCV 87.5 78.0 - 100.0 fL   MCH 29.5 26.0 - 34.0 pg   MCHC 33.7 30.0 - 36.0 g/dL   RDW 13.7 11.5 - 15.5 %   Platelets 296 150 - 400 K/uL   Neutrophils Relative % 67 %   Neutro Abs 4.2 1.7 - 7.7 K/uL  Lymphocytes Relative 23 %   Lymphs Abs 1.4 0.7 - 4.0 K/uL   Monocytes Relative 10 %   Monocytes Absolute 0.6 0.1 - 1.0 K/uL   Eosinophils Relative 0 %   Eosinophils Absolute 0.0 0.0 - 0.7 K/uL   Basophils Relative 0 %   Basophils Absolute 0.0 0.0 - 0.1 K/uL    Comment: Performed at [facility], [place] [address] [place], [place] [address]  Urine rapid drug screen (hosp performed)     Status: Abnormal   Collection Time: 02/08/18  3:35 PM  Result Value Ref Range   Opiates NONE DETECTED NONE DETECTED   Cocaine NONE DETECTED NONE DETECTED   Benzodiazepines POSITIVE (A) NONE DETECTED   Amphetamines NONE DETECTED NONE DETECTED   Tetrahydrocannabinol POSITIVE (A) NONE DETECTED   Barbiturates NONE DETECTED NONE DETECTED    Comment: (NOTE) DRUG SCREEN FOR MEDICAL PURPOSES ONLY.  IF CONFIRMATION IS NEEDED FOR ANY PURPOSE, NOTIFY LAB WITHIN 5 DAYS. LOWEST DETECTABLE LIMITS FOR URINE DRUG SCREEN Drug Class                     Cutoff (ng/mL) Amphetamine and metabolites    1000 Barbiturate and metabolites    200 Benzodiazepine                 237 Tricyclics and metabolites     300 Opiates and metabolites        300 Cocaine and metabolites        300 THC                            50 Performed at [facility], [place] [address] [place], [place] [address]     Current Facility-Administered Medications  Medication Dose Route Frequency Provider Last Rate Last Dose  . hydrOXYzine (ATARAX/VISTARIL) tablet 25 mg  25 mg Oral TID PRN Ethelene Hal, NP      . QUEtiapine (SEROQUEL) tablet 50  mg  50 mg Oral BID Hampton Abbot, MD   50 mg at 02/09/18 0916  . traZODone (DESYREL) tablet 100 mg  100 mg Oral QHS Ethelene Hal, NP   100 mg at 02/08/18 2131   Current Outpatient Medications  Medication Sig Dispense Refill  . sertraline (ZOLOFT) 100 MG tablet Take 1 tablet (100 mg total) by mouth daily. 90 tablet 1  . sertraline (ZOLOFT) 25 MG tablet Take 1 tablet (25 mg total) by mouth daily. 90 tablet 1    Musculoskeletal: Strength & Muscle Tone: within normal limits Gait & Station: normal Patient leans: N/A  Psychiatric Specialty Exam: Physical Exam  Constitutional: He appears well-developed and well-nourished.  HENT:  Head: Normocephalic.  Respiratory: Effort normal.  Musculoskeletal: Normal range of motion.  Neurological: He is alert.  Psychiatric: His mood appears anxious. His speech is rapid and/or pressured. He is not agitated. Thought content is paranoid. Cognition and memory are normal. He expresses impulsivity. He exhibits a depressed mood.    Review of Systems  Psychiatric/Behavioral: Positive for depression and substance abuse. Negative for hallucinations, memory loss and suicidal ideas. The patient is nervous/anxious. The patient does not have insomnia.   All other systems reviewed and are negative.   Blood pressure 111/87, pulse 80, temperature 98 F (36.7 C), temperature source Oral, resp. rate 16, height _0  (1.778 m), weight 79.4 kg, SpO2 99 %.Body mass index is 25.11 kg/m.  General Appearance: Casual  Eye Contact:  Good  Speech:  Clear and Coherent and Normal Rate  Volume:  Normal  Mood:  Anxious, Depressed and Irritable  Affect:  Congruent and Depressed  Thought Process:  Coherent, Goal Directed and Linear  Orientation:  Full (Time, Place, and Person)  Thought Content:  Ideas of Reference:   Paranoia and Paranoid Ideation  Suicidal Thoughts:  No  Homicidal Thoughts:  No  Memory:  Immediate;   Good Recent;   Fair Remote;   Fair  Judgement:   Fair  Insight:  Shallow  Psychomotor Activity:  Normal  Concentration:  Concentration: Fair and Attention Span: Fair  Recall:  [place] of Knowledge:  Good  Language:  Good  Akathisia:  No  Handed:  Right  AIMS (if indicated):     Assets:  Warehouse manager Resources/Insurance  ADL's:  Intact  Cognition:  WNL  Sleep:        Treatment Plan Summary: Daily contact with patient to assess and evaluate symptoms and progress in treatment and Medication management  -Crisis Stabilization Medications:-Seroquel 50 mg BID for mood stabilization -Vistaril 25 mg TID PRN for anxiety -Trazodone 50 mg QHS PRN for sleep  Disposition: Recommend psychiatric Inpatient admission when medically cleared. TTS to seek placement  Ethelene Hal, NP 02/09/2018 12:03 PM
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
## 2018-02-09 NOTE — ED Notes (Addendum)
Pt continues to be anxious, especially concerning the whereabouts of his car keys and his car. Appears to be paranoid and hyper-religious. Calm and cooperative.

## 2018-02-09 NOTE — Tx Team (Signed)
Initial Treatment Plan 02/09/2018 6:44 PM [name] CBU:[id number]    PATIENT STRESSORS: Financial difficulties Legal issue Occupational concerns Substance abuse   PATIENT STRENGTHS: Ability for insight Average or above average intelligence Communication skills   PATIENT IDENTIFIED PROBLEMS: "close to God"  'learn coping skill"  Substance Abuse  Depression  Anxiety             DISCHARGE CRITERIA:  Ability to meet basic life and health needs Adequate post-discharge living arrangements Motivation to continue treatment in a less acute level of care  PRELIMINARY DISCHARGE PLAN: Attend aftercare/continuing care group Outpatient therapy Placement in alternative living arrangements  PATIENT/FAMILY INVOLVEMENT: This treatment plan has been presented to and reviewed with the patient, Raymon Gatewood, and/or family member.  The patient and family have been given the opportunity to ask questions and make suggestions.  Clarene Critchley, RN 02/09/2018, 6:44 PM

## 2018-02-09 NOTE — Plan of Care (Signed)
  Problem: Activity: Goal: Interest or engagement in leisure activities will improve Outcome: Progressing Goal: Imbalance in normal sleep/wake cycle will improve Outcome: Progressing   Problem: Coping: Goal: Ability to identify and develop effective coping behavior will improve Outcome: Progressing   Problem: Health Behavior/Discharge Planning: Goal: Identification of resources available to assist in meeting health care needs will improve Outcome: Progressing   Problem: Self-Concept: Goal: Will verbalize positive feelings about self Outcome: Progressing   Problem: Education: Goal: Knowledge of [facility] Education information/materials will improve Outcome: Progressing Goal: Emotional status will improve Outcome: Progressing Goal: Mental status will improve Outcome: Progressing Goal: Verbalization of understanding the information provided will improve Outcome: Progressing   Problem: Activity: Goal: Interest or engagement in activities will improve Outcome: Progressing Goal: Sleeping patterns will improve Outcome: Progressing   Problem: Coping: Goal: Ability to verbalize frustrations and anger appropriately will improve Outcome: Progressing Goal: Ability to demonstrate self-control will improve Outcome: Progressing   Problem: Health Behavior/Discharge Planning: Goal: Identification of resources available to assist in meeting health care needs will improve Outcome: Progressing Goal: Compliance with treatment plan for underlying cause of condition will improve Outcome: Progressing   Problem: Physical Regulation: Goal: Ability to maintain clinical measurements within normal limits will improve Outcome: Progressing   Problem: Safety: Goal: Periods of time without injury will increase Outcome: Progressing

## 2018-02-10 DIAGNOSIS — F332 Major depressive disorder, recurrent severe without psychotic features: Principal | ICD-10-CM

## 2018-02-10 MED ORDER — ARIPIPRAZOLE 10 MG PO TABS
10.0000 mg | ORAL_TABLET | Freq: Every day | ORAL | Status: DC
  Administered 2018-02-10 – 2018-02-13 (×4): 10 mg via ORAL
  Filled 2018-02-10 (×7): qty 1

## 2018-02-10 MED ORDER — TRAZODONE HCL 50 MG PO TABS
50.0000 mg | ORAL_TABLET | Freq: Every evening | ORAL | Status: DC | PRN
  Administered 2018-02-11 (×2): 50 mg via ORAL
  Filled 2018-02-10 (×2): qty 1

## 2018-02-10 MED ORDER — HYDROXYZINE HCL 50 MG PO TABS
50.0000 mg | ORAL_TABLET | Freq: Four times a day (QID) | ORAL | Status: DC | PRN
  Administered 2018-02-11 (×2): 50 mg via ORAL
  Filled 2018-02-10 (×2): qty 1

## 2018-02-10 MED ORDER — SERTRALINE HCL 25 MG PO TABS
125.0000 mg | ORAL_TABLET | Freq: Every day | ORAL | Status: DC
  Administered 2018-02-10 – 2018-02-13 (×4): 125 mg via ORAL
  Filled 2018-02-10 (×7): qty 1

## 2018-02-10 NOTE — Plan of Care (Signed)
Nurse discussed anxiety, depression, coping skills with patient. 

## 2018-02-10 NOTE — [facility]HH Suicide Risk Assessment (Signed)
Eric Baldwin Admission Suicide Risk Assessment   Nursing information obtained from:  Patient Demographic factors:  Male Current Mental Status:  NA Loss Factors:  Financial problems / change in socioeconomic status Historical Factors:  NA Risk Reduction Factors:  NA  Total Time spent with patient: 1 hour Principal Problem: Major depressive disorder, recurrent episode, severe with mixed features (HCC) Diagnosis:   Patient Active Problem List   Diagnosis Date Noted  . Major depressive disorder, recurrent episode, severe with mixed features (HCC) [F33.2] 02/09/2018  . Screen for STD (sexually transmitted disease) [Z11.3] 10/26/2016  . Dysuria [R30.0] 10/26/2016   Subjective Data: see H&P  Continued Clinical Symptoms:    The "Alcohol Use Disorders Identification Test", Guidelines for Use in Primary Care, Second Edition.  World Science writer [place] [facility]). Score between 0-7:  no or low risk or alcohol related problems. Score between 8-15:  moderate risk of alcohol related problems. Score between 16-19:  high risk of alcohol related problems. Score 20 or above:  warrants further diagnostic evaluation for alcohol dependence and treatment.   Psychiatric Specialty Exam: Physical Exam  Nursing note and vitals reviewed.     Blood pressure 114/80, pulse 71, temperature 98.3 F (36.8 C), temperature source Oral, resp. rate 18, height 5\' 10"  (1.778 m), weight 70.3 kg, SpO2 100 %.Body mass index is 22.24 kg/m.    COGNITIVE FEATURES THAT CONTRIBUTE TO RISK:  Closed-mindedness    SUICIDE RISK:   Minimal: No identifiable suicidal ideation.  Patients presenting with no risk factors but with morbid ruminations; may be classified as minimal risk based on the severity of the depressive symptoms  PLAN OF CARE: See H&P  I certify that inpatient services furnished can reasonably be expected to improve the patient's condition.   Eric Baldwin 02/10/2018, 4:49 PM
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
## 2018-02-10 NOTE — [facility] Group Notes (Signed)
[facility] LCSW Group Therapy  02/10/2018 3:20 PM  Type of Therapy:  Group Therapy  Participation Level:  Did Not Attend  Participation Quality:  Did not attend  Affect:  Did not attend  Cognitive:  Did not attend  Insight:  Did not attend  Engagement in Therapy:  Did not attend  Modes of Intervention:  Did not attend  Summary of Progress/Problems:  Did not attend  Cherie Bohaboy 02/10/2018, 3:20 PM

## 2018-02-10 NOTE — Tx Team (Signed)
Interdisciplinary Treatment and Diagnostic Plan Update  02/10/2018 Time of Session: 10:32 AM  Eric Baldwin MRN: [id number]  Principal Diagnosis: <principal problem not specified>  Secondary Diagnoses: Active Problems:   Schizophrenia, paranoid type ([place])   Current Medications:  Current Facility-Administered Medications  Medication Dose Route Frequency Provider Last Rate Last Dose  . acetaminophen (TYLENOL) tablet 650 mg  650 mg Oral Q6H PRN Ethelene Hal, NP   650 mg at 02/09/18 2126  . alum & mag hydroxide-simeth (MAALOX/MYLANTA) 200-200-20 MG/5ML suspension 30 mL  30 mL Oral Q4H PRN Ethelene Hal, NP      . hydrOXYzine (ATARAX/VISTARIL) tablet 25 mg  25 mg Oral TID PRN Ethelene Hal, NP   25 mg at 02/09/18 2126  . Influenza vac split quadrivalent PF (FLUARIX) injection 0.5 mL  0.5 mL Intramuscular Tomorrow-1000 Maris Berger T, MD      . magnesium hydroxide (MILK OF MAGNESIA) suspension 30 mL  30 mL Oral Daily PRN Ethelene Hal, NP      . pneumococcal 23 valent vaccine (PNU-IMMUNE) injection 0.5 mL  0.5 mL Intramuscular Tomorrow-1000 Pennelope Bracken, MD      . QUEtiapine (SEROQUEL) tablet 50 mg  50 mg Oral BID Ethelene Hal, NP   50 mg at 02/10/18 0727  . traZODone (DESYREL) tablet 100 mg  100 mg Oral QHS Ethelene Hal, NP   100 mg at 02/09/18 2126  . traZODone (DESYREL) tablet 50 mg  50 mg Oral QHS PRN Ethelene Hal, NP        PTA Medications: Medications Prior to Admission  Medication Sig Dispense Refill Last Dose  . sertraline (ZOLOFT) 100 MG tablet Take 1 tablet (100 mg total) by mouth daily. 90 tablet 1 02/07/2018 at Unknown time  . sertraline (ZOLOFT) 25 MG tablet Take 1 tablet (25 mg total) by mouth daily. 90 tablet 1 02/07/2018 at Unknown time    Patient Stressors: Financial difficulties Legal issue Occupational concerns Substance abuse  Patient Strengths: Ability for insight Average or above  average intelligence Communication skills  Treatment Modalities: Medication Management, Group therapy, Case management,  1 to 1 session with clinician, Psychoeducation, Recreational therapy.   Physician Treatment Plan for Primary Diagnosis: <principal problem not specified> Long Term Goal(s): Improvement in symptoms so as ready for discharge  Short Term Goals:    Medication Management: Evaluate patient's response, side effects, and tolerance of medication regimen.  Therapeutic Interventions: 1 to 1 sessions, Unit Group sessions and Medication administration.  Evaluation of Outcomes: Progressing  Physician Treatment Plan for Secondary Diagnosis: Active Problems:   Schizophrenia, paranoid type ([place])   Long Term Goal(s): Improvement in symptoms so as ready for discharge  Short Term Goals:    Medication Management: Evaluate patient's response, side effects, and tolerance of medication regimen.  Therapeutic Interventions: 1 to 1 sessions, Unit Group sessions and Medication administration.  Evaluation of Outcomes: Progressing   RN Treatment Plan for Primary Diagnosis: <principal problem not specified> Long Term Goal(s): Knowledge of disease and therapeutic regimen to maintain health will improve  Short Term Goals: Ability to identify and develop effective coping behaviors will improve and Compliance with prescribed medications will improve  Medication Management: RN will administer medications as ordered by provider, will assess and evaluate patient's response and provide education to patient for prescribed medication. RN will report any adverse and/or side effects to prescribing provider.  Therapeutic Interventions: 1 on 1 counseling sessions, Psychoeducation, Medication administration, Evaluate responses to treatment, Monitor vital signs  and CBGs as ordered, Perform/monitor CIWA, COWS, AIMS and Fall Risk screenings as ordered, Perform wound care treatments as  ordered.  Evaluation of Outcomes: Progressing   LCSW Treatment Plan for Primary Diagnosis: <principal problem not specified> Long Term Goal(s): Safe transition to appropriate next level of care at discharge, Engage patient in therapeutic group addressing interpersonal concerns.  Short Term Goals: Engage patient in aftercare planning with referrals and resources  Therapeutic Interventions: Assess for all discharge needs, 1 to 1 time with Social worker, Explore available resources and support systems, Assess for adequacy in community support network, Educate family and significant other(s) on suicide prevention, Complete Psychosocial Assessment, Interpersonal group therapy.  Evaluation of Outcomes: Met  Return home and follow up outpt   Progress in Treatment: Attending groups: Yes Participating in groups: Yes Taking medication as prescribed: Yes Toleration medication: Yes, no side effects reported at this time Family/Significant other contact made: No Patient understands diagnosis: No Limited insight Discussing patient identified problems/goals with staff: Yes Medical problems stabilized or resolved: Yes Denies suicidal/homicidal ideation: Yes Issues/concerns per patient self-inventory: None Other: N/A  New problem(s) identified: None identified at this time.   New Short Term/Long Term Goal(s): "I just want to get myself together so I can work my two jobs. I want to learn how to stay focused."  Discharge Plan or Barriers:   Reason for Continuation of Hospitalization: Mood instability Medication stabilization   Estimated Length of Stay: 9/11  Attendees: Patient: Eric Baldwin 02/10/2018  10:32 AM  Physician: Maris Berger, MD 02/10/2018  10:32 AM  Nursing: Sena Hitch, RN 02/10/2018  10:32 AM  RN Care Manager: Lars Pinks, RN 02/10/2018  10:32 AM  Social Worker: Ripley Fraise 02/10/2018  10:32 AM  Recreational Therapist: Winfield Cunas 02/10/2018  10:32 AM  Other:  Norberto Sorenson 02/10/2018  10:32 AM  Other:  02/10/2018  10:32 AM    Scribe for Treatment Team:  Roque Lias LCSW 02/10/2018 10:32 AM

## 2018-02-10 NOTE — Progress Notes (Signed)
Recreation Therapy Notes  Date: 9.6.19 Time: 1000 Location: 500 Hall Dayroom   Group Topic: Communication, Team Building, Problem Solving  Goal Area(s) Addresses:  Patient will effectively work with peer towards shared goal.  Patient will identify skills used to make activity successful.  Patient will identify how skills used during activity can be used to reach post d/c goals.  Intervention: STEM Activity  Activity: Landing Pad. In teams patients were given 12 plastic drinking straws and a length of masking tape. Using the materials provided patients were asked to build a landing pad to catch a golf ball dropped from approximately 6 feet in the air.   Education: Social Skills, Discharge Planning   Education Outcome: Acknowledges education/In group clarification offered/Needs additional education.   Clinical Observations/Feedback: Pt did not attend group.    Flem Enderle, LRT/CTRS         Kameryn Davern A 02/10/2018 12:26 PM 
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
## 2018-02-10 NOTE — Progress Notes (Signed)
D:  Patient's self inventory sheet, patient sleeps good, sleep medictiton helpful.  Fair appetite, normal energy level, good concentration.  Denied depression, hopeless and anxiety.  Denied withdrawals.  Denied SI.  Denied physical problems.  Denied physical pain.  Goal is get better.  Plans to keep feeling good.  No discharge plans. A:  Medications administered per MD orders.  Emotional support and encouragement given patient. R:  Denied SI and HI, contracts for safety.  Denied A/V hallucinations.  Safety maintained with 15 minute checks.

## 2018-02-10 NOTE — B[facility] Suicide Risk Assessment (Signed)
[facility] INPATIENT:  Family/Significant Other Suicide Prevention Education  Suicide Prevention Education:  Education Completed; No one has been identified by the patient as the family member/significant other with whom the patient will be residing, and identified as the person(s) who will aid the patient in the event of a mental health crisis (suicidal ideations/suicide attempt).  With written consent from the patient, the family member/significant other has been provided the following suicide prevention education, prior to the and/or following the discharge of the patient.  The suicide prevention education provided includes the following:  Suicide risk factors  Suicide prevention and interventions  National Suicide Hotline telephone number  [facility] assessment telephone number  [facility] Emergency Assistance 911  [facility] and/or Residential Mobile Crisis Unit telephone number  Request made of family/significant other to:  Remove weapons (e.g., guns, rifles, knives), all items previously/currently identified as safety concern.    Remove drugs/medications (over-the-counter, prescriptions, illicit drugs), all items previously/currently identified as a safety concern.  The family member/significant other verbalizes understanding of the suicide prevention education information provided.  The family member/significant other agrees to remove the items of safety concern listed above. The patient did not endorse SI at the time of admission, nor did the patient c/o SI during the stay here.  SPE not required.  Ida Rogue 02/10/2018, 4:50 PM
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
## 2018-02-11 MED ORDER — POTASSIUM CHLORIDE CRYS ER 20 MEQ PO TBCR
20.0000 meq | EXTENDED_RELEASE_TABLET | Freq: Two times a day (BID) | ORAL | Status: AC
  Administered 2018-02-11 – 2018-02-12 (×4): 20 meq via ORAL
  Filled 2018-02-11 (×4): qty 1

## 2018-02-11 NOTE — Progress Notes (Signed)
Patient states that he had a good day and enjoyed the dinner. He also verbalized that he met some new people as well and went to the gym to play basketball. His goal for tomorrow is to get better.  

## 2018-02-11 NOTE — [facility] Group Notes (Addendum)
  [facility] LCSW Group Therapy Note  Date/Time:  02/11/2018 11:15AM-12:00PM  Type of Therapy and Topic:  Group Therapy:  Feelings About Hospitalization  Participation Level:  Active   Description of Group This process group involved patients discussing their feelings related to being hospitalized, as well as the benefits they see to being in the hospital.  These feelings and benefits were itemized.  The group then brainstormed specific ways in which they could seek those same benefits when they discharge and return home.  Therapeutic Goals 1. Patient will identify and describe positive and negative feelings related to hospitalization 2. Patient will verbalize benefits of hospitalization to themselves personally 3. Patients will brainstorm together ways they can obtain similar benefits in the outpatient setting, identify barriers to wellness and possible solutions  Summary of Patient Progress:  The patient expressed his primary feelings about being hospitalized are that he needed help and enjoyed his time in the hospital which he needed "for a break," but now he is ready to go.  He emphasized numerous times that he has an apartment where he is supposed to move on Monday and needs to go home to do this, to pay for the apartment so he does not lose it, and to return to his 2 jobs.  Therapeutic Modalities Cognitive Behavioral Therapy Motivational Interviewing    Ambrose Mantle, LCSW 02/11/2018, 8:33 AM

## 2018-02-11 NOTE — Plan of Care (Signed)
D: Patient presents bizarre, tangential. He continually repeated "Maybe I am here because of low potassium." He had just been told by the nurse about an hour prior that he had low potassium on admission and we were treating him with oral potassium tablets. He believes he was admitted due to potassium causing somatic problems. He slept well last night, and received medication that was helpful. His appetite is good, energy normal, and concentration good. He rates his depression, feeling of hopelessness and anxiety 0/10. He denies withdrawal symptoms or physical complaints. Patient denies SI/HI/AVH.  A: Patient checked q15 min, and checks reviewed. Reviewed medication changes with patient and educated on side effects. Educated patient on importance of attending group therapy sessions and educated on several coping skills. Encouarged participation in milieu through recreation therapy and attending meals with peers. Support and encouragement provided. Fluids offered. R: Patient receptive to education on medications, and is medication compliant. Patient contracts for safety on the unit. He reports "I just want to get better" and "I going to listen to what the doc tells me. When am I going to get discharge?"

## 2018-02-11 NOTE — Progress Notes (Addendum)
[facility] MD Progress Note  02/11/2018 9:01 AM Eric Baldwin  MRN:  [id number] Subjective: Eric Baldwin seen resting in bed.  Patient is awake alert and oriented x3.  Patient reports " I am not crazy, GOD is just in my heart and nobody understands that."  Reports he is employed by [employer] for the past 17 years and states he has a part[place] where was a misunderstanding related to his mood.  States he is always happy.  Denies suicidal or homicidal ideations.  Denies paranoia ideations.  Denies depression or depressive symptoms.  Denies that he has been taking medications daily.  Patient is requesting to be discharged by Monday.  States he has no apartment will be ready by Monday and he needs to get back to work.  Reports a good appetite.  States he is resting well.  Chart reviewed: Potassium 2.9. Order k-dur f/u , noted patient is isolated to the room.  Reports taking medications as prescribed and tolerating them well.  Support encouragement reassurance was provided  History:[name] is a 42 y/o M with history of treatment for depression who was admitted from [facility] on IVC placed by police after they responded to alter[place]. Pt was apparently agitated, yelling, and preoccupied with religious topics such as God and infinity. Pt was medically cleared at [facility] and then transferred to [facility] for additional treatment and stabilization.  Principal Problem: Major depressive disorder, recurrent episode, severe with mixed features (HCC) Diagnosis:   Patient Active Problem List   Diagnosis Date Noted  . Major depressive disorder, recurrent episode, severe with mixed features (HCC) [F33.2] 02/09/2018  . Screen for STD (sexually transmitted disease) [Z11.3] 10/26/2016  . Dysuria [R30.0] 10/26/2016   Total Time spent with patient: 20 minutes  Past Psychiatric History:   Past Medical History: History reviewed. No pertinent past medical history. History reviewed. No  pertinent surgical history. Family History:  Family History  Problem Relation Age of Onset  . Heart disease Father    Family Psychiatric  History:  Social History:  Social History   Substance and Sexual Activity  Alcohol Use Yes  . Alcohol/week: 10.0 standard drinks  . Types: 5 Cans of beer, 5 Shots of liquor per week     Social History   Substance and Sexual Activity  Drug Use No    Social History   Socioeconomic History  . Marital status: Single    Spouse name: Not on file  . Number of children: Not on file  . Years of education: Not on file  . Highest education level: Not on file  Occupational History  . Not on file  Social Needs  . Financial resource strain: Not on file  . Food insecurity:    Worry: Not on file    Inability: Not on file  . Transportation needs:    Medical: Not on file    Non-medical: Not on file  Tobacco Use  . Smoking status: Former Smoker    Types: E-cigarettes  . Smokeless tobacco: Never Used  Substance and Sexual Activity  . Alcohol use: Yes    Alcohol/week: 10.0 standard drinks    Types: 5 Cans of beer, 5 Shots of liquor per week  . Drug use: No  . Sexual activity: Not on file  Lifestyle  . Physical activity:    Days per week: Not on file    Minutes per session: Not on file  . Stress: Not on file  Relationships  . Social connections:    Talks on phone: Not on file    Gets together: Not on file    Attends religious service: Not on file    Active member of club or organization: Not on file    Attends meetings of clubs or organizations: Not on file    Relationship status: Not on file  Other Topics Concern  . Not on file  Social History Narrative  . Not on file   Additional Social History:                         Sleep: Fair  Appetite:  Fair  Current Medications: Current Facility-Administered Medications  Medication Dose Route Frequency Provider Last Rate Last Dose  . acetaminophen (TYLENOL) tablet 650 mg  650  mg Oral Q6H PRN Laveda Abbe, NP   650 mg at 02/09/18 2126  . alum & mag hydroxide-simeth (MAALOX/MYLANTA) 200-200-20 MG/5ML suspension 30 mL  30 mL Oral Q4H PRN Laveda Abbe, NP      . ARIPiprazole (ABILIFY) tablet 10 mg  10 mg Oral Daily Eric Baldwin Likens, MD   10 mg at 02/11/18 0852  . hydrOXYzine (ATARAX/VISTARIL) tablet 50 mg  50 mg Oral Q6H PRN Eric Baldwin Likens, MD   50 mg at 02/11/18 0103  . magnesium hydroxide (MILK OF MAGNESIA) suspension 30 mL  30 mL Oral Daily PRN Laveda Abbe, NP      . sertraline (ZOLOFT) tablet 125 mg  125 mg Oral Daily Eric Baldwin Likens, MD   125 mg at 02/11/18 0852  . traZODone (DESYREL) tablet 50 mg  50 mg Oral QHS PRN,MR X 1 [place], Burlene Arnt, MD   50 mg at 02/11/18 0103    Lab Results: No results found for this or any previous visit (from the past 48 hour(s)).  Blood Alcohol level:  Lab Results  Component Value Date   ETH <10 02/08/2018    Metabolic Disorder Labs: No results found for: HGBA1C, MPG No results found for: PROLACTIN No results found for: CHOL, TRIG, HDL, CHOLHDL, VLDL, LDLCALC  Physical Findings: AIMS: Facial and Oral Movements Muscles of Facial Expression: None, normal Lips and Perioral Area: None, normal Jaw: None, normal Tongue: None, normal,Extremity Movements Upper (arms, wrists, hands, fingers): None, normal Lower (legs, knees, ankles, toes): None, normal, Trunk Movements Neck, shoulders, hips: None, normal, Overall Severity Severity of abnormal movements (highest score from questions above): None, normal Incapacitation due to abnormal movements: None, normal Patient's awareness of abnormal movements (rate only patient's report): No Awareness, Dental Status Current problems with teeth and/or dentures?: No Does patient usually wear dentures?: No  CIWA:  CIWA-Ar Total: 1 COWS:  COWS Total Score: 1  Musculoskeletal: Strength & Muscle Tone: within normal limits Gait &  Station: normal Patient leans: N/A  Psychiatric Specialty Exam: Physical Exam  Constitutional: He appears well-developed.    Review of Systems  Psychiatric/Behavioral: Positive for substance abuse. Negative for depression, hallucinations and suicidal ideas. The patient is nervous/anxious. The patient does not have insomnia.   All other systems reviewed and are negative.   Blood pressure 124/88, pulse 71, temperature 98.2 F (36.8 C), temperature source Oral, resp. rate 18, height 5\' 10"  (1.778 m), weight 70.3 kg, SpO2 100 %.Body mass index is 22.24 kg/m.  General Appearance: Casual and Guarded  Eye Contact:  Fair  Speech:  Clear and Coherent  Volume:  Normal  Mood:  Anxious  Affect:  Congruent  Thought Process:  Coherent  Orientation:  Full (Time, Place, and Person)  Thought Content:  Logical  Suicidal Thoughts:  No  Homicidal Thoughts:  No  Memory:  Immediate;   Fair Recent;   Fair Remote;   Fair  Judgement:  Fair  Insight:  Fair  Psychomotor Activity:  Normal  Concentration:  Concentration: Fair  Recall:  [employer] of Knowledge:  Fair  Language:  Fair  Akathisia:  No  Handed:  Right  AIMS (if indicated):     Assets:  Communication Skills Desire for Improvement Financial Resources/Insurance Resilience Social Support  ADL's:  Intact  Cognition:  WNL  Sleep:  Number of Hours: 3.75     Treatment Plan Summary: Daily contact with patient to assess and evaluate symptoms and progress in treatment and Medication management   Continue with current treatment plan on 02/11/2018 as listed below except were noted  Continue Abilify 10 mg p.o. daily for mood stabilization Continue with Zoloft 125 mg for mood stabilization. Continue with Trazodone 50 mg for insomnia   Will continue to monitor vitals ,medication compliance and treatment side effects while patient is here.  Reviewed labs: K 2.9, repeat CMP 9/9/2019BAL - , UDS +thc and benzodizpines.  CSW will continue  working on disposition.  Patient to participate in therapeutic milieu   Oneta Rack, NP 02/11/2018, 9:01 AM   ..Agree with NP Progress Note

## 2018-02-11 NOTE — Progress Notes (Signed)
Patient ID: Eric Baldwin, male   DOB: 1975/09/26, 42 y.o.   MRN: [id number] DAR Note: Pt remained withdrawn to his room all evening. Pt at assessment denied any SI/HI, anxiety, depression, pain or AVH. Pt was med compliant. All patient's questions and concerns addressed. Support, encouragement, and safe environment provided.

## 2018-02-12 NOTE — Progress Notes (Signed)
Patient ID: Eric Baldwin, male   DOB: 1975/11/19, 42 y.o.   MRN: [id number] DAR Note: Pt with an anxious and worried affect remained withdrawn to his room all evening. Pt at assessment denied any SI/HI, anxiety, depression, pain or AVH. Pt was med compliant. All patient's questions and concerns addressed. Support, encouragement, and safe environment provided.

## 2018-02-12 NOTE — [facility] Group Notes (Signed)
Adult Psychoeducational Group Note  Date:  02/12/2018 Time:  10:00 PM  Group Topic/Focus:  Wrap-Up Group:   The focus of this group is to help patients review their daily goal of treatment and discuss progress on daily workbooks.  Participation Level:  Active  Participation Quality:  Appropriate and Attentive  Affect:  Appropriate  Cognitive:  Alert and Appropriate  Insight: Appropriate and Good  Engagement in Group:  Engaged  Modes of Intervention:  Discussion and Education  Additional Comments:  Pt attended and participated in wrap up group this evening. Pt rated their day a 9/10, due to them eating, going outside and playing cards with hall mates. Pt completed their goal, which was to feel better once they have taken their meds.    Eric Baldwin 02/12/2018, 10:00 PM

## 2018-02-12 NOTE — Progress Notes (Signed)
D   Pt is pleasant and cooperative   He interacts appropriately with staff and peers  A   Verbal support given  Medications administered and effectiveness monitored   Q 15 min checks   R   Pt is safe at this time

## 2018-02-12 NOTE — Progress Notes (Signed)
Patient ID: Eric Baldwin, male   DOB: [birth year], 42 y.o.   MRN: [id number]     D: Pt has been appropriate on the unit today. Pt attended all groups and engaged in treatment. Pt reported that his depression was a 0, his hopelessness was a 0, and his anxiety was a 0. Pt reported that his goal was to try to get better and treat people like he wants to be treated. Pt reported being negative SI/HI, no AH/VH noted. A: 15 min checks continued for patient safety. R: Pt safety maintained.

## 2018-02-12 NOTE — Progress Notes (Addendum)
[facility] MD Progress Note  02/12/2018 12:19 PM Eric Baldwin  MRN:  [id number]   Subjective: Eric Baldwin observed resting in bed.  Chart review patient continues to isolate to room. He is awake alert and oriented x3 .pleasant, calm and cooperative.  Continues to express concerns about discharging on tomorrow.  Reports she is missed too many days of work from [employer] and is eager to get back to work.  Reports he has not been able [place] his [place] airport however states that he is confident that he can return.  Reports his niece recently put him out of her house due to a verbal disagreement.  States he has already found an apartment and has plans to move in on tomorrow.  Denies suicidal homicidal ideations.  Denies auditory or visual hallucinations.  Denies delusions or paranoia.  Denies depression or depressive symptoms.  Reports taking medication as prescribed. support,encouragement and reassurance was provided.    Patient History:Eric Baldwin is a 42 y/o M with history of treatment for depression who was admitted from [facility] on IVC placed by police after they responded to altercation [place]. Pt was apparently agitated, yelling, and preoccupied with religious topics such as God and infinity. Pt was medically cleared at [facility] and then transferred to [facility] for additional treatment and stabilization.  Principal Problem: Major depressive disorder, recurrent episode, severe with mixed features (HCC) Diagnosis:   Patient Active Problem List   Diagnosis Date Noted  . Major depressive disorder, recurrent episode, severe with mixed features (HCC) [F33.2] 02/09/2018  . Screen for STD (sexually transmitted disease) [Z11.3] 10/26/2016  . Dysuria [R30.0] 10/26/2016   Total Time spent with patient: 20 minutes  Past Psychiatric History:   Past Medical History: History reviewed. No pertinent past medical history. History reviewed. No pertinent surgical history. Family History:   Family History  Problem Relation Age of Onset  . Heart disease Father    Family Psychiatric  History:  Social History:  Social History   Substance and Sexual Activity  Alcohol Use Yes  . Alcohol/week: 10.0 standard drinks  . Types: 5 Cans of beer, 5 Shots of liquor per week     Social History   Substance and Sexual Activity  Drug Use No    Social History   Socioeconomic History  . Marital status: Single    Spouse name: Not on file  . Number of children: Not on file  . Years of education: Not on file  . Highest education level: Not on file  Occupational History  . Not on file  Social Needs  . Financial resource strain: Not on file  . Food insecurity:    Worry: Not on file    Inability: Not on file  . Transportation needs:    Medical: Not on file    Non-medical: Not on file  Tobacco Use  . Smoking status: Former Smoker    Types: E-cigarettes  . Smokeless tobacco: Never Used  Substance and Sexual Activity  . Alcohol use: Yes    Alcohol/week: 10.0 standard drinks    Types: 5 Cans of beer, 5 Shots of liquor per week  . Drug use: No  . Sexual activity: Not on file  Lifestyle  . Physical activity:    Days per week: Not on file    Minutes per session: Not on file  . Stress: Not on file  Relationships  . Social connections:    Talks on phone: Not on file  Gets together: Not on file    Attends religious service: Not on file    Active member of club or organization: Not on file    Attends meetings of clubs or organizations: Not on file    Relationship status: Not on file  Other Topics Concern  . Not on file  Social History Narrative  . Not on file   Additional Social History:                         Sleep: Fair  Appetite:  Fair  Current Medications: Current Facility-Administered Medications  Medication Dose Route Frequency Provider Last Rate Last Dose  . acetaminophen (TYLENOL) tablet 650 mg  650 mg Oral Q6H PRN Laveda Abbe, NP    650 mg at 02/09/18 2126  . alum & mag hydroxide-simeth (MAALOX/MYLANTA) 200-200-20 MG/5ML suspension 30 mL  30 mL Oral Q4H PRN Laveda Abbe, NP      . ARIPiprazole (ABILIFY) tablet 10 mg  10 mg Oral Daily Eric Baldwin Likens, MD   10 mg at 02/12/18 0745  . hydrOXYzine (ATARAX/VISTARIL) tablet 50 mg  50 mg Oral Q6H PRN Eric Baldwin Likens, MD   50 mg at 02/11/18 2145  . magnesium hydroxide (MILK OF MAGNESIA) suspension 30 mL  30 mL Oral Daily PRN Laveda Abbe, NP      . potassium chloride SA (K-DUR,KLOR-CON) CR tablet 20 mEq  20 mEq Oral BID Oneta Rack, NP   20 mEq at 02/12/18 0745  . sertraline (ZOLOFT) tablet 125 mg  125 mg Oral Daily Eric Baldwin Likens, MD   125 mg at 02/12/18 0745  . traZODone (DESYREL) tablet 50 mg  50 mg Oral QHS PRN,MR X 1 Eric Baldwin Likens, MD   50 mg at 02/11/18 2145    Lab Results: No results found for this or any previous visit (from the past 48 hour(s)).  Blood Alcohol level:  Lab Results  Component Value Date   ETH <10 02/08/2018    Metabolic Disorder Labs: No results found for: HGBA1C, MPG No results found for: PROLACTIN No results found for: CHOL, TRIG, HDL, CHOLHDL, VLDL, LDLCALC  Physical Findings: AIMS: Facial and Oral Movements Muscles of Facial Expression: None, normal Lips and Perioral Area: None, normal Jaw: None, normal Tongue: None, normal,Extremity Movements Upper (arms, wrists, hands, fingers): None, normal Lower (legs, knees, ankles, toes): None, normal, Trunk Movements Neck, shoulders, hips: None, normal, Overall Severity Severity of abnormal movements (highest score from questions above): None, normal Incapacitation due to abnormal movements: None, normal Patient's awareness of abnormal movements (rate only patient's report): No Awareness, Dental Status Current problems with teeth and/or dentures?: No Does patient usually wear dentures?: No  CIWA:  CIWA-Ar Total: 2 COWS:  COWS Total Score:  0  Musculoskeletal: Strength & Muscle Tone: within normal limits Gait & Station: normal Patient leans: N/A  Psychiatric Specialty Exam: Physical Exam  Vitals reviewed. Constitutional: He appears well-developed.  Neurological: He is alert.  Psychiatric: He has a normal mood and affect. His behavior is normal.    Review of Systems  Psychiatric/Behavioral: Positive for substance abuse. Negative for depression, hallucinations and suicidal ideas. The patient is nervous/anxious. The patient does not have insomnia.   All other systems reviewed and are negative.   Blood pressure (!) 123/93, pulse 71, temperature 98.2 F (36.8 C), temperature source Oral, resp. rate 18, height 5\' 10"  (1.778 m), weight 70.3 kg, SpO2 100 %.Body mass index is 22.24  kg/m.  General Appearance: Casual and Guarded  Eye Contact:  Fair  Speech:  Clear and Coherent  Volume:  Normal  Mood:  Anxious  Affect:  Congruent  Thought Process:  Coherent  Orientation:  Full (Time, Place, and Person)  Thought Content:  Logical  Suicidal Thoughts:  No  Homicidal Thoughts:  No  Memory:  Immediate;   Fair Recent;   Fair Remote;   Fair  Judgement:  Fair  Insight:  Fair  Psychomotor Activity:  Normal  Concentration:  Concentration: Fair  Recall:  [employer] of Knowledge:  Fair  Language:  Fair  Akathisia:  No  Handed:  Right  AIMS (if indicated):     Assets:  Communication Skills Desire for Improvement Financial Resources/Insurance Resilience Social Support  ADL's:  Intact  Cognition:  WNL  Sleep:  Number of Hours: 5     Treatment Plan Summary: Daily contact with patient to assess and evaluate symptoms and progress in treatment and Medication management   Continue with current treatment plan on 02/12/2018 as listed below except were noted  Continue Abilify 10 mg p.o. daily for mood stabilization Continue with Zoloft 125 mg for mood stabilization. Continue with Trazodone 50 mg for insomnia   Will continue  to monitor vitals ,medication compliance and treatment side effects while patient is here.  Reviewed labs: K 2.9, repeat CMP 9/9/2019BAL - , UDS +thc and benzodizpines.  CSW will continue working on disposition.  Patient to participate in therapeutic milieu   Oneta Rack, NP 02/12/2018, 12:19 PM   ..Agree with NP Progress Note

## 2018-02-12 NOTE — BHH Group Notes (Signed)
[facility] LCSW Group Therapy Note  Date/Time:  02/12/2018  11:00AM-12:00PM  Type of Therapy and Topic:  Group Therapy:  Music and Mood  Participation Level:  Active   Description of Group: In this process group, members listened to a variety of genres of music and identified that different types of music evoke different responses.  Patients were encouraged to identify music that was soothing for them and music that was energizing for them.  Patients discussed how this knowledge can help with wellness and recovery in various ways including managing depression and anxiety as well as encouraging healthy sleep habits.    Therapeutic Goals: 1. Patients will explore the impact of different varieties of music on mood 2. Patients will verbalize the thoughts they have when listening to different types of music 3. Patients will identify music that is soothing to them as well as music that is energizing to them 4. Patients will discuss how to use this knowledge to assist in maintaining wellness and recovery 5. Patients will explore the use of music as a coping skill  Summary of Patient Progress:  At the beginning of group, patient expressed that he felt good and at the end of group said the same.  He was very interactive, but more analytical of the music's lyrics than of his own feelings.  Therapeutic Modalities: Solution Focused Brief Therapy Activity   Ambrose Mantle, LCSW

## 2018-02-13 LAB — POTASSIUM: Potassium: 4.8 mmol/L (ref 3.5–5.1)

## 2018-02-13 MED ORDER — HYDROXYZINE HCL 50 MG PO TABS: 50.0000 mg | ORAL_TABLET | Freq: Four times a day (QID) | ORAL | 0 refills | Status: DC | PRN

## 2018-02-13 MED ORDER — SERTRALINE HCL 25 MG PO TABS: 25.0000 mg | ORAL_TABLET | Freq: Every day | ORAL | 0 refills | Status: DC

## 2018-02-13 MED ORDER — TRAZODONE HCL 50 MG PO TABS: 50.0000 mg | ORAL_TABLET | Freq: Every evening | ORAL | 0 refills | Status: DC | PRN

## 2018-02-13 MED ORDER — SERTRALINE HCL 100 MG PO TABS: 100.0000 mg | ORAL_TABLET | Freq: Every day | ORAL | 0 refills | Status: DC

## 2018-02-13 MED ORDER — ARIPIPRAZOLE 10 MG PO TABS: 10.0000 mg | ORAL_TABLET | Freq: Every day | ORAL | 0 refills | Status: DC

## 2018-02-13 NOTE — Plan of Care (Signed)
Pt attended one recreational therapy group session and is discharging.   Caroll Rancher, LRT/CTRS

## 2018-02-13 NOTE — Discharge Summary (Addendum)
Physician Discharge Summary Note  Patient:  Eric Baldwin is an 42 y.o., male MRN:  [id number] DOB:  04-Jan-1976 Patient phone:  [phone] (home)  Patient address:   [address] [place] [address],  Total Time spent with patient: 20 minutes  Date of Admission:  02/09/2018 Date of Discharge: 02/13/18  Reason for Admission:  Worsening psychosis  Principal Problem: Major depressive disorder, recurrent episode, severe with mixed features [facility]) Discharge Diagnoses: Patient Active Problem List   Diagnosis Date Noted  . Major depressive disorder, recurrent episode, severe with mixed features (HCC) [F33.2] 02/09/2018  . Screen for STD (sexually transmitted disease) [Z11.3] 10/26/2016  . Dysuria [R30.0] 10/26/2016    Past Psychiatric History: -tx for depression in the past - no previous inpatient stays, but pt had residential treatment for cannabis use around age 62 - no outpatient provider, but pt sees PCP for antidepressants - no previous suicide attempt  Past Medical History: History reviewed. No pertinent past medical history. History reviewed. No pertinent surgical history. Family History:  Family History  Problem Relation Age of Onset  . Heart disease Father    Family Psychiatric  History: maternal niece possible hx of OCD and bipolar. Social History:  Social History   Substance and Sexual Activity  Alcohol Use Yes  . Alcohol/week: 10.0 standard drinks  . Types: 5 Cans of beer, 5 Shots of liquor per week     Social History   Substance and Sexual Activity  Drug Use No    Social History   Socioeconomic History  . Marital status: Single    Spouse name: Not on file  . Number of children: Not on file  . Years of education: Not on file  . Highest education level: Not on file  Occupational History  . Not on file  Social Needs  . Financial resource strain: Not on file  . Food insecurity:    Worry: Not on file    Inability: Not on file  .  Transportation needs:    Medical: Not on file    Non-medical: Not on file  Tobacco Use  . Smoking status: Former Smoker    Types: E-cigarettes  . Smokeless tobacco: Never Used  Substance and Sexual Activity  . Alcohol use: Yes    Alcohol/week: 10.0 standard drinks    Types: 5 Cans of beer, 5 Shots of liquor per week  . Drug use: No  . Sexual activity: Not on file  Lifestyle  . Physical activity:    Days per week: Not on file    Minutes per session: Not on file  . Stress: Not on file  Relationships  . Social connections:    Talks on phone: Not on file    Gets together: Not on file    Attends religious service: Not on file    Active member of club or organization: Not on file    Attends meetings of clubs or organizations: Not on file    Relationship status: Not on file  Other Topics Concern  . Not on file  Social History Narrative  . Not on file    Hospital Course:   02/10/18 [facility] MD Assessment: 42 y/o M with history of treatment for depression who was admitted from [facility] on IVC placed by police after they responded [place]. Pt was apparently agitated, yelling, and preoccupied with religious topics such as God and infinity. Pt was medically cleared at [facility] and then  transferred to [facility] for additional treatment and stabilization. Upon initial evaluation, pt shares, "I went to work, and I tried to pray to God, and you have to make it your business," referring to his manager with whom he had the altercation. Pt reports he was having a discussion with another co-worker about religion when his manager attempted to join the conversation which pt did not want and this resulted in escalating verbal altercation as his boss kept asking about what they were talking about. Pt endorses that he was yelling and upset. He denies grandiose thoughts. He denies SI/HI/AH/VH. He reports his sleep has been poor after losing his housing with his niece about 1 week  ago and having to stay with a friend. He endorses some guilty feelings and poor concentration and depressed mood. He denies other symptoms of mania, OCD, and PTSD. He reports using cannabis about twice per week and he uses vaporized tobacco twice per day. He denies other illicit substance use. Discussed with patient about treatment options. He follows up with his PCP and he has good adherence to sertraline which he reports has been helpful and he has been taking for a few years. He slept well with addition of seroquel last night, and we instead discussed trial of abilify and pt was in agreement. We will continue sertraline. Pt was in agreement with the above plan, and he had no further questions, comments, or concerns.  Patient remained on the [facility] unit for 3 days. The patient stabilized on medication and therapy. Patient was discharged on Abilify 10 mg Daily, Zoloft 125 mg Daily, Trazodone 50 mg QHS PRN, Vistaril 50 mg Q6H PRN. Patient has shown improvement with improved mood, affect, sleep, appetite, and interaction. Patient has attended group and participated. Patient has been seen in the day room interacting with peers and staff appropriately. Patient denies any SI/HI/AVH and contracts for safety. Patient agrees to follow up at [facility]. Patient is provided with prescriptions for their medications upon discharge.   Physical Findings: AIMS: Facial and Oral Movements Muscles of Facial Expression: None, normal Lips and Perioral Area: None, normal Jaw: None, normal Tongue: None, normal,Extremity Movements Upper (arms, wrists, hands, fingers): None, normal Lower (legs, knees, ankles, toes): None, normal, Trunk Movements Neck, shoulders, hips: None, normal, Overall Severity Severity of abnormal movements (highest score from questions above): None, normal Incapacitation due to abnormal movements: None, normal Patient's awareness of abnormal movements (rate only patient's  report): No Awareness, Dental Status Current problems with teeth and/or dentures?: No Does patient usually wear dentures?: No  CIWA:  CIWA-Ar Total: 2 COWS:  COWS Total Score: 0  Musculoskeletal: Strength & Muscle Tone: within normal limits Gait & Station: normal Patient leans: N/A  Psychiatric Specialty Exam: Physical Exam  Nursing note and vitals reviewed. Constitutional: He is oriented to person, place, and time. He appears well-developed and well-nourished.  Cardiovascular: Normal rate.  Respiratory: Effort normal.  Musculoskeletal: Normal range of motion.  Neurological: He is alert and oriented to person, place, and time.  Skin: Skin is warm.    Review of Systems  Constitutional: Negative.   HENT: Negative.   Eyes: Negative.   Respiratory: Negative.   Cardiovascular: Negative.   Gastrointestinal: Negative.   Genitourinary: Negative.   Musculoskeletal: Negative.   Skin: Negative.   Neurological: Negative.   Endo/Heme/Allergies: Negative.   Psychiatric/Behavioral: Negative.     Blood pressure 131/90, pulse 64, temperature 97.9 F (36.6 C), resp. rate 20, height 5\' 10"  (1.778 m), weight 70.3  kg, SpO2 100 %.Body mass index is 22.24 kg/m.  General Appearance: Casual  Eye Contact:  Good  Speech:  Clear and Coherent and Normal Rate  Volume:  Normal  Mood:  Euthymic  Affect:  Congruent  Thought Process:  Goal Directed and Descriptions of Associations: Intact  Orientation:  Full (Time, Place, and Person)  Thought Content:  WDL  Suicidal Thoughts:  No  Homicidal Thoughts:  No  Memory:  Immediate;   Good Recent;   Good Remote;   Good  Judgement:  Fair  Insight:  Fair  Psychomotor Activity:  Normal  Concentration:  Concentration: Good and Attention Span: Good  Recall:  Good  Fund of Knowledge:  Good  Language:  Good  Akathisia:  No  Handed:  Right  AIMS (if indicated):     Assets:  Communication Skills Desire for Improvement Financial  Resources/Insurance Housing Physical Health Social Support Transportation  ADL's:  Intact  Cognition:  WNL  Sleep:  Number of Hours: 1.5     Have you used any form of tobacco in the last 30 days? (Cigarettes, Smokeless Tobacco, Cigars, and/or Pipes): No  Has this patient used any form of tobacco in the last 30 days? (Cigarettes, Smokeless Tobacco, Cigars, and/or Pipes) Yes, No  Blood Alcohol level:  Lab Results  Component Value Date   ETH <10 02/08/2018    Metabolic Disorder Labs:  No results found for: HGBA1C, MPG No results found for: PROLACTIN No results found for: CHOL, TRIG, HDL, CHOLHDL, VLDL, LDLCALC  See Psychiatric Specialty Exam and Suicide Risk Assessment completed by Attending Physician prior to discharge.  Discharge destination:  Home  Is patient on multiple antipsychotic therapies at discharge:  No   Has Patient had three or more failed trials of antipsychotic monotherapy by history:  No  Recommended Plan for Multiple Antipsychotic Therapies: NA   Allergies as of 02/13/2018      Reactions   Mango Flavor Rash      Medication List    TAKE these medications     Indication  ARIPiprazole 10 MG tablet Commonly known as:  ABILIFY Take 1 tablet (10 mg total) by mouth daily. For mood control Start taking on:  02/14/2018  Indication:  mood stability   hydrOXYzine 50 MG tablet Commonly known as:  ATARAX/VISTARIL Take 1 tablet (50 mg total) by mouth every 6 (six) hours as needed for anxiety.  Indication:  Feeling Anxious   sertraline 25 MG tablet Commonly known as:  ZOLOFT Take 1 tablet (25 mg total) by mouth daily. Take with Sertraline 100 mg Daily for total of 125 mg Daily What changed:  additional instructions  Indication:  mood stability   sertraline 100 MG tablet Commonly known as:  ZOLOFT Take 1 tablet (100 mg total) by mouth daily. Take with Sertraline 25 mg Daily for total of 125 mg Daily What changed:  additional instructions  Indication:   mood stability   traZODone 50 MG tablet Commonly known as:  DESYREL Take 1 tablet (50 mg total) by mouth at bedtime as needed for sleep.  Indication:  Trouble Sleeping      Follow-up Information    [facility] Follow up.   Contact information: [address] [place] [address] [phone]           Follow-up recommendations:  Continue activity as tolerated. Continue diet as recommended by your PCP. Ensure to keep all appointments with outpatient providers.  Comments:  Patient is instructed prior to  discharge to: Take all medications as prescribed by his/her mental healthcare provider. Report any adverse effects and or reactions from the medicines to his/her outpatient provider promptly. Patient has been instructed & cautioned: To not engage in alcohol and or illegal drug use while on prescription medicines. In the event of worsening symptoms, patient is instructed to call the crisis hotline, 911 and or go to the nearest ED for appropriate evaluation and treatment of symptoms. To follow-up with his/her primary care provider for your other medical issues, concerns and or health care needs.    Signed: Gerlene Burdock Money, FNP 02/13/2018, 9:08 AM   Patient seen, Suicide Assessment Completed.  Disposition Plan Reviewed

## 2018-02-13 NOTE — Progress Notes (Signed)
Recreation Therapy Notes   Date: 9.9.19 Time: 1000 Location: 500 Hall Dayroom  Group Topic: Goal Setting  Goal Area(s) Addresses:  Patient will successfully set at least 3 goals for their future during group.  Patient will successfully identify benefit of setting goals.    Behavioral Response: Engaged  Intervention: Worksheet, pencils  Activity: Goal Setting.  Patients were given worksheets that were broken into 6 categories (family, friends, work/school, body, mental health and spirituality).  Patients were to identify what they are doing well, what they want to improve and set a goal of how to make that improvement.  Patients will then share their top 3 goals with the group.    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    Caroll Rancher, LRT/CTRS     Caroll Rancher A 02/13/2018 12:27 PM

## 2018-02-13 NOTE — Progress Notes (Signed)
  [facility] Adult Case Management Discharge Plan :  Will you be returning to the same living situation after discharge:  Yes,  with friends At discharge, do you have transportation home?: Yes,  pt paying to take a cab Do you have the ability to pay for your medications: Yes,  [employer]  Release of information consent forms completed and in the chart;  Patient's signature needed at discharge.  Patient to Follow up at: Follow-up Information    [facility]. Go on 02/27/2018.   Why:  Please attend your medication appt with Leone Payor on Monday, 02/27/18, at 1:00pm.  Please attend your therapy appt with Amber on Wednesday, 03/01/18, at 9:00am. Contact information: [address] [place] [address] [phone]        [facility] Follow up.   Specialty:  Behavioral Health Why:  If you need to be seen prior to your [facility] appt time, please attend a walk in appt at [facility] at 8am Monday-Friday, access crisis services at any time. Contact information: [address] [place] [address] [phone]           Next level of care provider has access to [facility] Link:no  Safety Planning and Suicide Prevention discussed: No. NA  Have you used any form of tobacco in the last 30 days? (Cigarettes, Smokeless Tobacco, Cigars, and/or Pipes): No  Has patient been referred to the Quitline?: N/A patient is not a smoker  Patient has been referred for addiction treatment: Pt. refused referral  [name], LCSW 02/13/2018, 1:16 PM

## 2018-02-13 NOTE — Progress Notes (Signed)
Patient discharged to lobby. Patient was stable and appreciative at that time. All papers and prescriptions were given and valuables returned. Verbal understanding expressed. Denies SI/HI and A/VH. Patient given opportunity to express concerns and ask questions.  

## 2018-02-13 NOTE — Progress Notes (Signed)
Recreation Therapy Notes  INPATIENT RECREATION TR PLAN  Patient Details Name: Eric Baldwin MRN: [id number] DOB: 1976-06-03 Today's Date: 02/13/2018  Rec Therapy Plan Is patient appropriate for Therapeutic Recreation?: Yes Treatment times per week: 3 times per week Estimated Length of Stay: 5-7 days  TR Treatment/Interventions: Group participation (Comment)  Discharge Criteria Pt will be discharged from therapy if:: Discharged Treatment plan/goals/alternatives discussed and agreed upon by:: Patient/family  Discharge Summary Short term goals set: See patient care plan Short term goals met: Adequate for discharge Which groups?: Goal setting Reason goals not met: Pt being discharged. Therapeutic equipment acquired: None Reason patient discharged from therapy: Discharge from hospital Pt/family agrees with progress & goals achieved: Yes Date patient discharged from therapy: 02/13/18   Victorino Sparrow, LRT/CTRS    Ria Comment, Jyden Kromer A 02/13/2018, 12:26 PM

## 2018-02-13 NOTE — B[facility] Suicide Risk Assessment (Signed)
[facility] Discharge Suicide Risk Assessment   Principal Problem: Major depressive disorder, recurrent episode, severe with mixed features [facility]) Discharge Diagnoses:  Patient Active Problem List   Diagnosis Date Noted  . Major depressive disorder, recurrent episode, severe with mixed features (HCC) [F33.2] 02/09/2018  . Screen for STD (sexually transmitted disease) [Z11.3] 10/26/2016  . Dysuria [R30.0] 10/26/2016    Total Time spent with patient: 30 minutes  Musculoskeletal: Strength & Muscle Tone: within normal limits Gait & Station: normal Patient leans: N/A  Psychiatric Specialty Exam: Review of Systems  Constitutional: Negative for chills and fever.  Respiratory: Negative for cough and shortness of breath.   Cardiovascular: Negative for chest pain.  Gastrointestinal: Negative for abdominal pain, heartburn, nausea and vomiting.  Psychiatric/Behavioral: Negative for depression, hallucinations and suicidal ideas. The patient is not nervous/anxious and does not have insomnia.     Blood pressure 131/90, pulse 64, temperature 97.9 F (36.6 C), resp. rate 20, height 5\' 10"  (1.778 m), weight 70.3 kg, SpO2 100 %.Body mass index is 22.24 kg/m.  General Appearance: Casual and Fairly Groomed  Patent attorney::  Good  Speech:  Clear and Coherent and Normal Rate  Volume:  Normal  Mood:  Euthymic  Affect:  Appropriate and Congruent  Thought Process:  Coherent and Goal Directed  Orientation:  Full (Time, Place, and Person)  Thought Content:  Logical  Suicidal Thoughts:  No  Homicidal Thoughts:  No  Memory:  Immediate;   Fair Recent;   Fair Remote;   Fair  Judgement:  Poor  Insight:  Lacking  Psychomotor Activity:  Normal  Concentration:  Good  Recall:  Good  Fund of Knowledge:Good  Language: Good  Akathisia:  No  Handed:    AIMS (if indicated):     Assets:  Communication Skills Desire for Improvement Resilience Social Support  Sleep:  Number of Hours: 1.5  Cognition: WNL  ADL's:   Intact   Mental Status Per Nursing Assessment::   On Admission:  NA  Demographic Factors:  Male, Low socioeconomic status and Living alone  Loss Factors: Financial problems/change in socioeconomic status  Historical Factors: Impulsivity  Risk Reduction Factors:   Employed, Positive social support, Positive therapeutic relationship and Positive coping skills or problem solving skills  Continued Clinical Symptoms:  Severe Anxiety and/or Agitation Depression:   Severe  Cognitive Features That Contribute To Risk:  None    Suicide Risk:  Minimal: No identifiable suicidal ideation.  Patients presenting with no risk factors but with morbid ruminations; may be classified as minimal risk based on the severity of the depressive symptoms  Follow-up Information    [facility]   Contact information: [address] [place] [address] [phone]         Subjective Data:  Eric Baldwin is a 42 y/o M with history of treatment for depression who was admitted from [facility] on IVC placed by police after they responded [place] alter[place]. Pt was apparently agitated, yelling, and preoccupied with religious topics such as God and infinity. Pt was medically cleared at [facility] and then transferred to [facility] for additional treatment and stabilization. He was restarted on home medication of sertraline and he was also started on trial of abilify. Pt was calm and cooperative on the unit, and he had stability of his presenting symptoms while on the unit.  Upon evaluation today, pt shares, "I'm doing good. I really want to get going today so  I can get my apartment." Pt reports that overall he is doing well. He denies any specific concerns. His sleep was poor last night, which pt associates with his roommate getting up multiple times over the night. He also reports that he has been napping often during the day. He denies SI/HI/AH/VH. He is  tolerating his medications without side effect or difficulty, and he plans to continue his current regimen after discharge. Discussed with patient about minimizing escalating conflicts at work in the future so as to avoid coming to the hospital again, and pt verbalized good understanding. He was able to engage in safety planning including plan to return to [facility] or contact emergency services if he feels unable to maintain his own safety or the safety of others. Pt had no further questions, comments, or concerns.     Plan Of Care/Follow-up recommendations:   -Discharge to outpatient level of care   -MDD, recurrent, severe with mixed features   -Continue Abilify 10 mg po daily for mood stabilization   -Continue with Zoloft 125 mg po qDay for mood stabilization.   -Continue with Trazodone 50 mg po qhs for insomnia   Activity:  as tolerated Diet:  normal Tests:  NA Other:  see above for DC plan  [name], MD 02/13/2018, 8:56 AM

## 2018-05-10 ENCOUNTER — Other Ambulatory Visit: Payer: Self-pay | Admitting: Family Medicine

## 2018-05-10 DIAGNOSIS — F418 Other specified anxiety disorders: Secondary | ICD-10-CM

## 2018-05-18 ENCOUNTER — Other Ambulatory Visit: Payer: Self-pay | Admitting: Family Medicine

## 2018-05-18 DIAGNOSIS — F418 Other specified anxiety disorders: Secondary | ICD-10-CM

## 2018-05-19 NOTE — Telephone Encounter (Signed)
Patient needs any appointment.Previously notified.

## 2018-05-19 NOTE — Telephone Encounter (Signed)
Requested medication (s) are due for refill today yes  Requested medication (s) are on the active medication list yes  Future visit scheduled no Routing to PCP for consideration.   Requested Prescriptions  Pending Prescriptions Disp Refills   sertraline (ZOLOFT) 25 MG tablet [Pharmacy Med Name: SERTRALINE 25MG  TABLETS] 90 tablet     Sig: TAKE 1 TABLET(25 MG) BY MOUTH DAILY     Psychiatry:  Antidepressants - SSRI Failed - 05/18/2018  1:40 PM      Failed - Valid encounter within last 6 months    Recent Outpatient Visits          8 months ago Depression with anxiety   [facility], Asencion PartridgeJeffrey R, MD   10 months ago Need for prophylactic vaccination and inoculation against influenza   [facility], Asencion PartridgeJeffrey R, MD   1 year ago Facial injury, initial encounter   [facility], Marolyn HammockMichael L, PA-C   1 year ago Dysuria   [facility], Schubert[place]licia B, DO   2 years ago Needs flu shot   [facility], Marolyn HammockMichael L, PA-C             Passed - Completed PHQ-2 or PHQ-9 in the last 360 days.

## 2018-05-20 ENCOUNTER — Telehealth: Payer: Self-pay

## 2018-05-20 DIAGNOSIS — F418 Other specified anxiety disorders: Secondary | ICD-10-CM

## 2018-05-20 MED ORDER — SERTRALINE HCL 25 MG PO TABS: 25.0000 mg | ORAL_TABLET | Freq: Every day | ORAL | 0 refills | Status: DC

## 2018-05-20 MED ORDER — SERTRALINE HCL 100 MG PO TABS: 100.0000 mg | ORAL_TABLET | Freq: Every day | ORAL | 0 refills | Status: DC

## 2018-05-20 NOTE — Addendum Note (Signed)
Addended by: Meredith StaggersGREENE, Elih Mooney R on: 05/20/2018 01:26 PM   Modules accepted: Orders

## 2018-05-20 NOTE — Telephone Encounter (Signed)
   Plan at March office visit: "We can try 125mg  once per day of Zoloft, but let me know how that is doing in the next 4-6 weeks. Follow up in 3 months."  I do not see that he has an appointment scheduled.  We will temporarily refill medications until appointment. please schedule.  Thanks.

## 2018-05-20 NOTE — Telephone Encounter (Signed)
Refill req for Sertraline sent to Dr. Neva SeatGreene Pt has 100mg  rx and 25mg  rx

## 2018-05-22 NOTE — Telephone Encounter (Signed)
Refill request Sertraline 25 mg #90. Last ov 08/30/17. Noted in pt chart to f/u in 3 months. No appt on file.

## 2018-05-23 NOTE — Telephone Encounter (Signed)
Already ordered on 12/14.

## 2018-05-24 ENCOUNTER — Telehealth: Payer: Self-pay | Admitting: Family Medicine

## 2018-05-24 NOTE — Telephone Encounter (Signed)
LVM for pt to call the office and schedule an appt with Dr. Neva SeatGreene. Advised that Dr. Neva SeatGreene did temporarily fill his meds but he would need an appt before an additional refills were given. When pt calls back, please schedule an OV - med check /refill with Dr. Neva SeatGreene. Thank you!

## 2018-06-10 ENCOUNTER — Other Ambulatory Visit: Payer: Self-pay | Admitting: Family Medicine

## 2018-06-10 DIAGNOSIS — F418 Other specified anxiety disorders: Secondary | ICD-10-CM

## 2018-06-12 NOTE — Telephone Encounter (Signed)
Called pt and left message to call back to make an appointment 

## 2018-07-14 ENCOUNTER — Other Ambulatory Visit: Payer: Self-pay | Admitting: Family Medicine

## 2018-07-14 DIAGNOSIS — F418 Other specified anxiety disorders: Secondary | ICD-10-CM

## 2018-07-14 NOTE — Telephone Encounter (Signed)
Attempted to call pt. To advise he is overdue for an appt.;  mobile number (rec'd message not in service);  work number (no answer)

## 2018-07-14 NOTE — Telephone Encounter (Signed)
30 days refills provided but please clarify his follow-up plan.  Is he being followed here for this medication or by psychiatric care provider?  Thank you

## 2018-07-14 NOTE — Telephone Encounter (Signed)
Requested medication (s) are due for refill today:  yes  Requested medication (s) are on the active medication list:  yes  Future visit scheduled:  no  Last Refill: 05/20/18; #30; no refills on the 100 mg., and on the 25 mg. strength.   *attempted to contact pt. To remind that he is overdue for an appt.; no answer  Requested Prescriptions  Pending Prescriptions Disp Refills   sertraline (ZOLOFT) 100 MG tablet [Pharmacy Med Name: SERTRALINE 100MG  TABLETS] 30 tablet 0    Sig: TAKE 1 TABLET FOR PAIN DAILY(WITH 25 MG TABLET TO= 125 MG PER DAY)     Psychiatry:  Antidepressants - SSRI Failed - 07/14/2018  9:19 AM      Failed - Valid encounter within last 6 months    Recent Outpatient Visits          10 months ago Depression with anxiety   [facility], Asencion Partridge, MD   1 year ago Need for prophylactic vaccination and inoculation against influenza   [facility], Asencion Partridge, MD   1 year ago Facial injury, initial encounter   [facility] at [facility], Marolyn Hammock, PA-C   1 year ago Dysuria   [facility] at [place], [place] B, DO   2 years ago Needs flu shot   Primary [facility] at [place], Marolyn Hammock, PA-C             Passed - Completed PHQ-2 or PHQ-9 in the last 360 days.     sertraline (ZOLOFT) 25 MG tablet [Pharmacy Med Name: SERTRALINE 25MG  TABLETS] 30 tablet 0    Sig: TAKE 1 TABLET BY MOUTH DAILY(WITH 100 MG TABLET TO= 125 MG PER DAY)     Psychiatry:  Antidepressants - SSRI Failed - 07/14/2018  9:19 AM      Failed - Valid encounter within last 6 months    Recent Outpatient Visits          10 months ago Depression with anxiety   [facility], Asencion Partridge, MD   1 year ago Need for prophylactic vaccination and inoculation against influenza   [facility], Asencion Partridge, MD   1 year ago Facial injury, initial encounter   [facility] at [facility], Marolyn Hammock, PA-C   1 year ago Dysuria   Primary Care at  [place], [place] B, DO   2 years ago Needs flu shot   [facility] at [place], Marolyn Hammock, PA-C             Passed - Completed PHQ-2 or PHQ-9 in the last 360 days.

## 2018-07-14 NOTE — Telephone Encounter (Signed)
Please advise  Patient is requesting a refill of the following medications: Requested Prescriptions   Pending Prescriptions Disp Refills  . sertraline (ZOLOFT) 100 MG tablet [Pharmacy Med Name: SERTRALINE 100MG  TABLETS] 30 tablet 0    Sig: TAKE 1 TABLET FOR PAIN DAILY(WITH 25 MG TABLET TO= 125 MG PER DAY)  . sertraline (ZOLOFT) 25 MG tablet [Pharmacy Med Name: SERTRALINE 25MG  TABLETS] 30 tablet 0    Sig: TAKE 1 TABLET BY MOUTH DAILY(WITH 100 MG TABLET TO= 125 MG PER DAY)

## 2018-07-26 ENCOUNTER — Other Ambulatory Visit: Payer: Self-pay | Admitting: Family Medicine

## 2018-07-26 DIAGNOSIS — F418 Other specified anxiety disorders: Secondary | ICD-10-CM

## 2018-09-02 ENCOUNTER — Other Ambulatory Visit: Payer: Self-pay | Admitting: Family Medicine

## 2018-09-02 DIAGNOSIS — F418 Other specified anxiety disorders: Secondary | ICD-10-CM

## 2018-09-13 ENCOUNTER — Other Ambulatory Visit: Payer: Self-pay | Admitting: Family Medicine

## 2018-09-13 DIAGNOSIS — F418 Other specified anxiety disorders: Secondary | ICD-10-CM

## 2018-09-13 NOTE — Telephone Encounter (Signed)
Pt has has 2 courtesy refills. Pt is > 3 months overdue for appt.  Called pt and left message to make appt. Routing back to provider to review.

## 2018-09-13 NOTE — Telephone Encounter (Signed)
Requested medication (s) are due for refill today: yes  Requested medication (s) are on the active medication list: yes  Last refill:  07/14/18  Future visit scheduled: no   Notes to clinic:  Pt >3 months overdue for an appt. 2 courtesy refills have already been done. Called pt to make an appt Routing request to provider to review.   Requested Prescriptions  Pending Prescriptions Disp Refills   sertraline (ZOLOFT) 25 MG tablet [Pharmacy Med Name: SERTRALINE 25MG  TABLETS] 30 tablet 0    Sig: TAKE 1 TABLET BY MOUTH DAILY WITH 100MG  TABLET     Psychiatry:  Antidepressants - SSRI Failed - 09/13/2018  4:57 PM      Failed - Completed PHQ-2 or PHQ-9 in the last 360 days.      Failed - Valid encounter within last 6 months    Recent Outpatient Visits          1 year ago Depression with anxiety   [facility], Asencion Partridge, MD   1 year ago Need for prophylactic vaccination and inoculation against influenza   [facility], Asencion Partridge, MD   1 year ago Facial injury, initial encounter   [facility] at [facility], Marolyn Hammock, PA-C   1 year ago Dysuria   [facility] at [facility], [place] B, DO   2 years ago Needs flu shot   Primary Care at [facility], Marolyn Hammock, PA-C            sertraline (ZOLOFT) 100 MG tablet [Pharmacy Med Name: SERTRALINE 100MG  TABLETS] 30 tablet 0    Sig: TAKE 1 TABLET BY MOUTH DAILY WITH 25MG  TABLET     Psychiatry:  Antidepressants - SSRI Failed - 09/13/2018  4:57 PM      Failed - Completed PHQ-2 or PHQ-9 in the last 360 days.      Failed - Valid encounter within last 6 months    Recent Outpatient Visits          1 year ago Depression with anxiety   [facility], Asencion Partridge, MD   1 year ago Need for prophylactic vaccination and inoculation against influenza   [facility], Asencion Partridge, MD   1 year ago Facial injury, initial encounter   Primary Care at [facility], Marolyn Hammock, PA-C   1 year  ago Dysuria   [facility] at [place], [place] B, DO   2 years ago Needs flu shot   Primary Care at [facility], Marolyn Hammock, [place]

## 2018-09-20 ENCOUNTER — Other Ambulatory Visit: Payer: Self-pay | Admitting: Family Medicine

## 2018-09-20 DIAGNOSIS — F418 Other specified anxiety disorders: Secondary | ICD-10-CM

## 2018-09-20 NOTE — Telephone Encounter (Signed)
Requested medication (s) are due for refill today - yes- overdue  Requested medication (s) are on the active medication list -yes  Future visit scheduled -no  Last refill: 07/14/18  Notes to clinic: Patient is requesting a refill of medication that has already had courtesy RF- message was also already left for patient to schedule appointment with PCP. Sent for PCP review of request.  Requested Prescriptions  Pending Prescriptions Disp Refills   sertraline (ZOLOFT) 100 MG tablet [Pharmacy Med Name: SERTRALINE 100MG  TABLETS] 30 tablet 0    Sig: TAKE 1 TABLET BY MOUTH DAILY WITH 25MG  TABLET     Psychiatry:  Antidepressants - SSRI Failed - 09/20/2018  4:03 PM      Failed - Completed PHQ-2 or PHQ-9 in the last 360 days.      Failed - Valid encounter within last 6 months    Recent Outpatient Visits          1 year ago Depression with anxiety   [facility], Asencion Partridge, MD   1 year ago Need for prophylactic vaccination and inoculation against influenza   [facility], Asencion Partridge, MD   1 year ago Facial injury, initial encounter   [facility], Marolyn Hammock, PA-C   1 year ago Dysuria   Primary Care at [facility], [place] B, DO   2 years ago Needs flu shot   [facility], Marolyn Hammock, PA-C              Requested Prescriptions  Pending Prescriptions Disp Refills   sertraline (ZOLOFT) 100 MG tablet [Pharmacy Med Name: SERTRALINE 100MG  TABLETS] 30 tablet 0    Sig: TAKE 1 TABLET BY MOUTH DAILY WITH 25MG  TABLET     Psychiatry:  Antidepressants - SSRI Failed - 09/20/2018  4:03 PM      Failed - Completed PHQ-2 or PHQ-9 in the last 360 days.      Failed - Valid encounter within last 6 months    Recent Outpatient Visits          1 year ago Depression with anxiety   [facility], Asencion Partridge, MD   1 year ago Need for prophylactic vaccination and inoculation against influenza   [facility],  Asencion Partridge, MD   1 year ago Facial injury, initial encounter   [facility] at [facility], Marolyn Hammock, PA-C   1 year ago Dysuria   Primary Care at [place], [place] B, DO   2 years ago Needs flu shot   Primary Care at [facility], Marolyn Hammock, [place]

## 2018-09-25 ENCOUNTER — Other Ambulatory Visit: Payer: Self-pay

## 2018-09-25 ENCOUNTER — Telehealth: Payer: Self-pay | Admitting: Family Medicine

## 2018-09-25 ENCOUNTER — Telehealth: Payer: BLUE CROSS/BLUE SHIELD | Admitting: Family Medicine

## 2018-09-25 NOTE — Telephone Encounter (Signed)
Eric Baldwin tried contacting pt at [phone] several times this morning and again at 5:08 pm, no answer and no way to leave a message-prompt says to try your call again.

## 2018-09-25 NOTE — Telephone Encounter (Signed)
Patient called because he did not receive his call for his 10:40am appointment. His call back [phone], and this is the same number listed in chart. Patient does state he never got the call but needs his medication refilled.

## 2018-11-10 ENCOUNTER — Other Ambulatory Visit: Payer: Self-pay | Admitting: Family Medicine

## 2018-11-10 DIAGNOSIS — F418 Other specified anxiety disorders: Secondary | ICD-10-CM

## 2018-11-10 NOTE — Telephone Encounter (Signed)
Requested medication (s) are due for refill today: yes  Requested medication (s) are on the active medication list: yes  Last refill:  07/14/18  Future visit scheduled: No  Notes to clinic:  Unable to refill per protocol. Pt due for f/u appt. No future appt scheduled    Requested Prescriptions  Pending Prescriptions Disp Refills   sertraline (ZOLOFT) 25 MG tablet [Pharmacy Med Name: SERTRALINE 25MG  TABLETS] 30 tablet 0    Sig: TAKE 1 TABLET BY MOUTH DAILY WITH 100MG  TABLET     Psychiatry:  Antidepressants - SSRI Failed - 11/10/2018  1:08 PM      Failed - Valid encounter within last 6 months    Recent Outpatient Visits          1 year ago Depression with anxiety   [facility], Asencion Partridge, MD   1 year ago Need for prophylactic vaccination and inoculation against influenza   [facility], Asencion Partridge, MD   1 year ago Facial injury, initial encounter   Primary Care at [facility], Marolyn Hammock, PA-C   2 years ago Dysuria   Primary Care at [place], [place] B, DO   2 years ago Needs flu shot   Primary Care at [facility], Marolyn Hammock, PA-C             Passed - Completed PHQ-2 or PHQ-9 in the last 360 days.     sertraline (ZOLOFT) 100 MG tablet [Pharmacy Med Name: SERTRALINE 100MG  TABLETS] 30 tablet 0    Sig: TAKE 1 TABLET BY MOUTH DAILY WITH 25MG  TABLET     Psychiatry:  Antidepressants - SSRI Failed - 11/10/2018  1:08 PM      Failed - Valid encounter within last 6 months    Recent Outpatient Visits          1 year ago Depression with anxiety   [facility], Asencion Partridge, MD   1 year ago Need for prophylactic vaccination and inoculation against influenza   [facility], Asencion Partridge, MD   1 year ago Facial injury, initial encounter   Primary Care at [facility], Marolyn Hammock, PA-C   2 years ago Dysuria   Primary Care at [place], [place] B, DO   2 years ago Needs flu shot   Primary Care at [facility],  Marolyn Hammock, PA-C             Passed - Completed PHQ-2 or PHQ-9 in the last 360 days.

## 2018-11-29 ENCOUNTER — Telehealth: Payer: Self-pay | Admitting: Family Medicine

## 2018-11-29 NOTE — Telephone Encounter (Signed)
Patient would like a refill on the following medications and have them sent to his preferred pharmacy [facility] on [address].  sertraline (ZOLOFT) 100 MG tablet sertraline (ZOLOFT) 25 MG tablet

## 2018-12-04 NOTE — Telephone Encounter (Signed)
Advised per Velna Hatchet upon patients call that Dr. Hervey Ard cma will call him shortly about sertraline scripts.

## 2018-12-04 NOTE — Telephone Encounter (Signed)
Unable to reach pt at [phone].  Pt would need an appointment for refills

## 2018-12-05 NOTE — Telephone Encounter (Signed)
Tried multiple times to reach patient at [phone]

## 2018-12-06 NOTE — Telephone Encounter (Signed)
Pt called to follow up on refills. Advised that practice has tried to reach him multiple times to schedule an appt in order to get more refills. Pt stated he never received calls or voicemails and inquired if whoever calls him is letting the phone ring or leaving a message. Pt stated he had a telephone appt but never received a call for that. Phone number on file confirmed as correct and was the number that pt was calling from. Pt stated he may have to find other means to get his medication in the mean time while practice is closed. Please advise.

## 2018-12-11 ENCOUNTER — Other Ambulatory Visit: Payer: Self-pay

## 2018-12-11 ENCOUNTER — Ambulatory Visit: Payer: BC Managed Care – PPO | Admitting: Family Medicine

## 2018-12-11 ENCOUNTER — Encounter: Payer: Self-pay | Admitting: Family Medicine

## 2018-12-11 DIAGNOSIS — F418 Other specified anxiety disorders: Secondary | ICD-10-CM | POA: Diagnosis not present

## 2018-12-11 MED ORDER — SERTRALINE HCL 100 MG PO TABS: ORAL_TABLET | ORAL | 1 refills | Status: DC

## 2018-12-11 MED ORDER — SERTRALINE HCL 25 MG PO TABS: ORAL_TABLET | ORAL | 1 refills | Status: DC

## 2018-12-11 NOTE — Progress Notes (Signed)
Subjective:    Patient ID: Eric Baldwin, male    DOB: 08/24/1975, 43 y.o.   MRN: [id number]  HPI Eric Baldwin is a 43 y.o. male Presents today for: Chief Complaint  Patient presents with   Anxiety    6 month follow-up for medciation managment    Depression   Depression/anxiety: Last eval with me 08/2017. Off Zoloft 2018 to 07/2017. Restarted at 100mg  qd at that time. Increased anhedonia at 08/2017 visit - increased to 125mg  QD of zoloft. Plan on update in 4-6 weeks and then OV in 3 months. First visit today with me since that time.  ER/hospital notes reviewed.   [facility] 02[facility]2018 [facility]. Stress with housing - had been living with niece, then asked to leave.  Concern for psychotic sx's at job, prompting eval. Had been taking the zoloft 125mg  at that time. Felt like he was doing ok prior to argument with niece and at work.   In hospital, added seroquel to help sleep, but discharged on zoloft 125mg  qd, and changed from seroquel to abilify, then vistaril or trazodone to help sleep. Did not fill prescriptions after leaving hospital.   Ran out of Zoloft few months ago. Feeling more irritable off meds. Less calm in certain situations.  No SI/HI.  Denies a/v/t hallucinations. Some exercise with weights at home.  Living by self - doing ok with current living situation.  Sleeping ok - some days better than others.   Alcohol:  DUI earlier this year (when around wrong people, and off meds).  Decreased alcohol now - 3 drinks per week.  Marijuana - 1-2 times. No other IDU.   Working at [employer]. Work has been going well - no issues.   Patient Active Problem List   Diagnosis Date Noted   Major depressive disorder, recurrent episode, severe with mixed features (HCC) 02/09/2018   Screen for STD (sexually transmitted disease) 10/26/2016   Dysuria 10/26/2016   History reviewed. No pertinent past medical history. History reviewed. No pertinent surgical  history. Allergies  Allergen Reactions   Mango Flavor Rash   Prior to Admission medications   Medication Sig Start Date End Date Taking? Authorizing Provider  sertraline (ZOLOFT) 100 MG tablet TAKE 1 TABLET FOR PAIN DAILY(WITH 25 MG TABLET TO= 125 MG PER DAY) 07/14/18  Yes Shade FloodGreene, Keir Foland R, MD  sertraline (ZOLOFT) 25 MG tablet TAKE 1 TABLET BY MOUTH DAILY(WITH 100 MG TABLET TO= 125 MG PER DAY) 07/14/18  Yes Shade FloodGreene, Delonta Yohannes R, MD   Social History   Socioeconomic History   Marital status: Single    Spouse name: Not on file   Number of children: Not on file   Years of education: Not on file   Highest education level: Not on file  Occupational History   Not on file  Social Needs   Financial resource strain: Not on file   Food insecurity    Worry: Not on file    Inability: Not on file   Transportation needs    Medical: Not on file    Non-medical: Not on file  Tobacco Use   Smoking status: Former Smoker    Types: E-cigarettes   Smokeless tobacco: Never Used  Substance and Sexual Activity   Alcohol use: Yes    Alcohol/week: 10.0 standard drinks    Types: 5 Cans of beer, 5 Shots of liquor per week   Drug use: No   Sexual activity: Not on file  Lifestyle  Physical activity    Days per week: Not on file    Minutes per session: Not on file   Stress: Not on file  Relationships   Social connections    Talks on phone: Not on file    Gets together: Not on file    Attends religious service: Not on file    Active member of club or organization: Not on file    Attends meetings of clubs or organizations: Not on file    Relationship status: Not on file   Intimate partner violence    Fear of current or ex partner: Not on file    Emotionally abused: Not on file    Physically abused: Not on file    Forced sexual activity: Not on file  Other Topics Concern   Not on file  Social History Narrative   Not on file    Review of Systems  Psychiatric/Behavioral:  Positive for agitation (slight. ). Negative for hallucinations, self-injury and suicidal ideas.   Other per HPI     Objective:   Physical Exam Vitals signs reviewed.  Constitutional:      Appearance: He is well-developed.  HENT:     Head: Normocephalic and atraumatic.  Eyes:     Pupils: Pupils are equal, round, and reactive to light.  Neck:     Thyroid: No thyromegaly or thyroid tenderness.     Vascular: No carotid bruit or JVD.  Cardiovascular:     Rate and Rhythm: Normal rate and regular rhythm.     Heart sounds: Normal heart sounds. No murmur.  Pulmonary:     Effort: Pulmonary effort is normal.     Breath sounds: Normal breath sounds. No rales.  Skin:    General: Skin is warm and dry.  Neurological:     Mental Status: He is alert and oriented to person, place, and time.  Psychiatric:        Attention and Perception: Attention normal.        Mood and Affect: Mood normal. Affect is flat.        Speech: Speech normal.        Behavior: Behavior normal. Behavior is cooperative.        Thought Content: Thought content normal. Thought content is not paranoid or delusional. Thought content does not include homicidal or suicidal ideation. Thought content does not include homicidal or suicidal plan.    Vitals:   12/11/18 1727  BP: 129/76  Pulse: 83  Resp: 18  Temp: 98.7 F (37.1 C)  TempSrc: Oral  SpO2: 98%  Weight: 165 lb (74.8 kg)  Height: 5\' 11"  (1.803 m)        Assessment & Plan:   Eric Baldwin is a 43 y.o. male Depression with anxiety - Plan: sertraline (ZOLOFT) 25 MG tablet, sertraline (ZOLOFT) 100 MG tablet  -Episode last year requiring hospitalization, but denies any psychosis symptoms since that time, and at that time had increased stressors/home stressors.  Did report improved symptoms just back on Zoloft at 125 mg daily.  -Restart Zoloft initially at 75 mg daily for 1 week, then increase to 125 mg daily.  Follow-up in the next 4 to 6 weeks.  Importance of  maintenance of medication discussed and risk of serotonin withdrawal if stopping meds abruptly.  -RTC/ER precautions if any psychosis symptoms or worsening insomnia.  Understanding expressed  Meds ordered this encounter  Medications   sertraline (ZOLOFT) 25 MG tablet    Sig: TAKE 1 TABLET BY MOUTH  DAILY(WITH 100 MG TABLET TO= 125 MG PER DAY)    Dispense:  90 tablet    Refill:  1   sertraline (ZOLOFT) 100 MG tablet    Sig: TAKE 1 TABLET FOR PAIN DAILY(WITH 25 MG TABLET TO= 125 MG PER DAY)    Dispense:  90 tablet    Refill:  1   Patient Instructions     Restart  zoloft - initially 1/2 of the 100mg  pill and full pill of 25mg  for 1 week.   After 1 week - take full pill of 100 and 25mg .   Recheck with me in 6 weeks.   If you have lab work done today you will be contacted with your lab results within the next 2 weeks.  If you have not heard from [place] then please contact us. The fastest way to get your results is to register for My Chart.   IF you received an x-ray today, you will receive an invoice from [facility]. Please contact [facility] at [phone] with questions or concerns regarding your invoice.   IF you received labwork today, you will receive an invoice from [facility]. Please contact [facility] at [phone] with questions or concerns regarding your invoice.   Our billing staff will not be able to assist you with questions regarding bills from these companies.  You will be contacted with the lab results as soon as they are available. The fastest way to get your results is to activate your My Chart account. Instructions are located on the last page of this paperwork. If you have not heard from [place] regarding the results in 2 weeks, please contact this office.       Signed,   [name], MD [facility] at [facility].  12/12/18 1:44 PM

## 2018-12-11 NOTE — Patient Instructions (Addendum)
   Restart  zoloft - initially 1/2 of the 100mg  pill and full pill of 25mg  for 1 week.   After 1 week - take full pill of 100 and 25mg .   Recheck with me in 6 weeks.   If you have lab work done today you will be contacted with your lab results within the next 2 weeks.  If you have not heard from [place] then please contact us. The fastest way to get your results is to register for My Chart.   IF you received an x-ray today, you will receive an invoice from [facility]. Please contact [facility] at [phone] with questions or concerns regarding your invoice.   IF you received labwork today, you will receive an invoice from [place]. Please contact [facility] at [phone] with questions or concerns regarding your invoice.   Our billing staff will not be able to assist you with questions regarding bills from these companies.  You will be contacted with the lab results as soon as they are available. The fastest way to get your results is to activate your My Chart account. Instructions are located on the last page of this paperwork. If you have not heard from [place] regarding the results in 2 weeks, please contact this office.

## 2018-12-12 ENCOUNTER — Encounter: Payer: Self-pay | Admitting: Family Medicine

## 2019-01-22 ENCOUNTER — Encounter: Payer: Self-pay | Admitting: Family Medicine

## 2019-01-22 ENCOUNTER — Other Ambulatory Visit: Payer: Self-pay

## 2019-01-22 ENCOUNTER — Ambulatory Visit: Payer: BC Managed Care – PPO | Admitting: Family Medicine

## 2019-01-22 DIAGNOSIS — F418 Other specified anxiety disorders: Secondary | ICD-10-CM

## 2019-01-22 MED ORDER — SERTRALINE HCL 100 MG PO TABS: ORAL_TABLET | ORAL | 1 refills | Status: DC

## 2019-01-22 MED ORDER — SERTRALINE HCL 50 MG PO TABS: ORAL_TABLET | ORAL | 1 refills | Status: DC

## 2019-01-22 NOTE — Patient Instructions (Addendum)
We can try higher dose of Zoloft at 150mg  total per day.  If not improving in 6 weeks - come see me, otherwise 6 month follow up.   Return to the clinic or go to the nearest emergency room if any of your symptoms worsen or new symptoms occur.    If you have lab work done today you will be contacted with your lab results within the next 2 weeks.  If you have not heard from [place] then please contact us. The fastest way to get your results is to register for My Chart.   IF you received an x-ray today, you will receive an invoice from [facility]. Please contact [facility] at [phone] with questions or concerns regarding your invoice.   IF you received labwork today, you will receive an invoice from [place]. Please contact [facility] at [phone] with questions or concerns regarding your invoice.   Our billing staff will not be able to assist you with questions regarding bills from these companies.  You will be contacted with the lab results as soon as they are available. The fastest way to get your results is to activate your My Chart account. Instructions are located on the last page of this paperwork. If you have not heard from [place] regarding the results in 2 weeks, please contact this office.

## 2019-01-22 NOTE — Progress Notes (Signed)
Subjective:    Patient ID: Eric Baldwin, male    DOB: 23-Aug-1975, 43 y.o.   MRN: [id number]  HPI Eric Baldwin is a 43 y.o. male Presents today for: Chief Complaint  Patient presents with  . depression and anxiety    6 week f/u on zoloft. Doing well on the med and not having any issus at this time with the med   Depression:  Depression with anxiety.  Last discussed July 6.  Was evaluated in September 2019 at behavioral health, stress with that time and situational stressor at work.  Had been discharged on Seroquel and Zoloft 125 mg daily, plan for Abilify Vistaril or trazodone to help sleep.  However he did not fill those prescriptions after leaving hospital, felt like he had been doing okay on just Zoloft.  When I saw him in July he had been off Zoloft for a few months.  Some  increased irritability off medications.  Restarted on Zoloft 125 mg initially at 75 mg for 1 week then up to 125 mg QD.  Feels like mood doing ok, but some decreased interest at times. Feels like higher dose may be helpful.  No manic symptoms.  Work is ok, home situation ok.  Sleeping better.  No SI/HI.  No a/v/t/hallucinations. Cutting back on alcohol and marijuana - decreased use of both.     Depression screen [facility] 2/9 01/22/2019 12/11/2018 08/29/2017 07/14/2017 01/11/2017  Decreased Interest 2 0 0 3 0  Down, Depressed, Hopeless 0 0 0 1 0  PHQ - 2 Score 2 0 0 4 0  Altered sleeping 1 - - 2 -  Tired, decreased energy 0 - - 0 -  Change in appetite 0 - - 2 -  Feeling bad or failure about yourself  0 - - 2 -  Trouble concentrating 0 - - 2 -  Moving slowly or fidgety/restless 0 - - 2 -  Suicidal thoughts 0 - - 0 -  PHQ-9 Score 3 - - 14 -  Difficult doing work/chores - - - Very difficult -      Patient Active Problem List   Diagnosis Date Noted  . Major depressive disorder, recurrent episode, severe with mixed features ([place]) 02/09/2018  . Screen for STD (sexually transmitted disease) 10/26/2016  .  Dysuria 10/26/2016   No past medical history on file. No past surgical history on file. Allergies  Allergen Reactions  . Mango Flavor Rash   Prior to Admission medications   Medication Sig Start Date End Date Taking? Authorizing Provider  sertraline (ZOLOFT) 100 MG tablet TAKE 1 TABLET FOR PAIN DAILY(WITH 25 MG TABLET TO= 125 MG PER DAY) 12/11/18  Yes Wendie Agreste, MD  sertraline (ZOLOFT) 25 MG tablet TAKE 1 TABLET BY MOUTH DAILY(WITH 100 MG TABLET TO= 125 MG PER DAY) 12/11/18  Yes Wendie Agreste, MD   Social History   Socioeconomic History  . Marital status: Single    Spouse name: Not on file  . Number of children: Not on file  . Years of education: Not on file  . Highest education level: Not on file  Occupational History  . Not on file  Social Needs  . Financial resource strain: Not on file  . Food insecurity    Worry: Not on file    Inability: Not on file  . Transportation needs    Medical: Not on file    Non-medical: Not on file  Tobacco Use  . Smoking status: Former Smoker  Types: E-cigarettes  . Smokeless tobacco: Never Used  Substance and Sexual Activity  . Alcohol use: Yes    Alcohol/week: 10.0 standard drinks    Types: 5 Cans of beer, 5 Shots of liquor per week  . Drug use: No  . Sexual activity: Not on file  Lifestyle  . Physical activity    Days per week: Not on file    Minutes per session: Not on file  . Stress: Not on file  Relationships  . Social Musicianconnections    Talks on phone: Not on file    Gets together: Not on file    Attends religious service: Not on file    Active member of club or organization: Not on file    Attends meetings of clubs or organizations: Not on file    Relationship status: Not on file  . Intimate partner violence    Fear of current or ex partner: Not on file    Emotionally abused: Not on file    Physically abused: Not on file    Forced sexual activity: Not on file  Other Topics Concern  . Not on file  Social History  Narrative  . Not on file    Review of Systems     Objective:   Physical Exam Constitutional:      General: He is not in acute distress.    Appearance: He is well-developed.  HENT:     Head: Normocephalic and atraumatic.  Neck:     Thyroid: No thyroid mass or thyromegaly.  Cardiovascular:     Rate and Rhythm: Normal rate and regular rhythm.  Pulmonary:     Effort: Pulmonary effort is normal.  Neurological:     Mental Status: He is alert and oriented to person, place, and time.  Psychiatric:        Attention and Perception: Attention normal.        Mood and Affect: Affect is flat.        Speech: Speech normal.        Behavior: Behavior normal. Behavior is cooperative.        Thought Content: Thought content normal.    Vitals:   01/22/19 0915  BP: 114/75  Pulse: 67  Resp: 12  Temp: 98.4 F (36.9 C)  TempSrc: Oral  SpO2: 97%  Weight: 175 lb 6.4 oz (79.6 kg)          Assessment & Plan:   Eric Baldwin is a 43 y.o. male Depression with anxiety - Plan: sertraline (ZOLOFT) 50 MG tablet, sertraline (ZOLOFT) 100 MG tablet  - improved, still some anhedonia.   - increase to 150mg  zoloft, recheck in 6 weeks if not improved, 6 months if stable. Sooner if worse. Commended on decreased alcohol/marijuana use.   Meds ordered this encounter  Medications  . sertraline (ZOLOFT) 50 MG tablet    Sig: TAKE 1 TABLET BY MOUTH DAILY(WITH 100 MG TABLET for 150mg  QD)    Dispense:  90 tablet    Refill:  1  . sertraline (ZOLOFT) 100 MG tablet    Sig: TAKE 1 TABLET FOR PAIN DAILY(WITH 50 MG TABLET TO= 150MG  PER DAY)    Dispense:  90 tablet    Refill:  1   Patient Instructions   We can try higher dose of Zoloft at 150mg  total per day.  If not improving in 6 weeks - come see me, otherwise 6 month follow up.   Return to the clinic or go to the nearest emergency  room if any of your symptoms worsen or new symptoms occur.    If you have lab work done today you will be contacted  with your lab results within the next 2 weeks.  If you have not heard from us then please contact us. The fastest way to get your results is to register for My Chart.   IF you received an x-ray today, you will receive an invoice from [facility]. Please contact [facility] at [phone] with questions or concerns regarding your invoice.   IF you received labwork today, you will receive an invoice from [facility]. Please contact [facility] at [phone] with questions or concerns regarding your invoice.   Our billing staff will not be able to assist you with questions regarding bills from these companies.  You will be contacted with the lab results as soon as they are available. The fastest way to get your results is to activate your My Chart account. Instructions are located on the last page of this paperwork. If you have not heard from us regarding the results in 2 weeks, please contact this office.       Signed,   Meredith StaggersJeffrey Deaven Barron, MD [facility] at [facility].  01/22/19 10:09 AM
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
## 2019-06-12 ENCOUNTER — Other Ambulatory Visit: Payer: Self-pay | Admitting: Family Medicine

## 2019-06-12 DIAGNOSIS — F418 Other specified anxiety disorders: Secondary | ICD-10-CM

## 2019-06-13 ENCOUNTER — Other Ambulatory Visit: Payer: Self-pay | Admitting: Family Medicine

## 2019-06-13 DIAGNOSIS — F418 Other specified anxiety disorders: Secondary | ICD-10-CM

## 2019-07-19 ENCOUNTER — Emergency Department (HOSPITAL_COMMUNITY)
Admission: EM | Admit: 2019-07-19 | Discharge: 2019-07-20 | Disposition: A | Discharge status: Home / Self Care | Payer: BC Managed Care – PPO | Attending: Emergency Medicine | Admitting: Emergency Medicine

## 2019-07-19 DIAGNOSIS — R451 Restlessness and agitation: Secondary | ICD-10-CM | POA: Diagnosis present

## 2019-07-19 DIAGNOSIS — Z20822 Contact with and (suspected) exposure to covid-19: Secondary | ICD-10-CM | POA: Diagnosis not present

## 2019-07-19 DIAGNOSIS — F29 Unspecified psychosis not due to a substance or known physiological condition: Secondary | ICD-10-CM | POA: Diagnosis not present

## 2019-07-19 DIAGNOSIS — F19959 Other psychoactive substance use, unspecified with psychoactive substance-induced psychotic disorder, unspecified: Secondary | ICD-10-CM | POA: Diagnosis not present

## 2019-07-19 DIAGNOSIS — F1210 Cannabis abuse, uncomplicated: Secondary | ICD-10-CM

## 2019-07-19 DIAGNOSIS — Z79899 Other long term (current) drug therapy: Secondary | ICD-10-CM | POA: Diagnosis not present

## 2019-07-19 DIAGNOSIS — F1994 Other psychoactive substance use, unspecified with psychoactive substance-induced mood disorder: Secondary | ICD-10-CM

## 2019-07-19 MED ORDER — ZIPRASIDONE MESYLATE 20 MG IM SOLR
20.0000 mg | Freq: Once | INTRAMUSCULAR | Status: AC
  Administered 2019-07-20: 20 mg via INTRAMUSCULAR
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
## 2019-07-20 ENCOUNTER — Other Ambulatory Visit: Payer: Self-pay

## 2019-07-20 ENCOUNTER — Encounter (HOSPITAL_COMMUNITY): Payer: Self-pay | Admitting: Emergency Medicine

## 2019-07-20 DIAGNOSIS — F1994 Other psychoactive substance use, unspecified with psychoactive substance-induced mood disorder: Secondary | ICD-10-CM

## 2019-07-20 LAB — COMPREHENSIVE METABOLIC PANEL
ALT: 28 U/L (ref 0–44)
AST: 43 U/L — ABNORMAL HIGH (ref 15–41)
Albumin: 4.9 g/dL (ref 3.5–5.0)
Alkaline Phosphatase: 49 U/L (ref 38–126)
Anion gap: 17 — ABNORMAL HIGH (ref 5–15)
BUN: 18 mg/dL (ref 6–20)
CO2: 19 mmol/L — ABNORMAL LOW (ref 22–32)
Calcium: 9.7 mg/dL (ref 8.9–10.3)
Chloride: 104 mmol/L (ref 98–111)
Creatinine, Ser: 1.24 mg/dL (ref 0.61–1.24)
GFR calc Af Amer: >60 mL/min (ref >60)
GFR calc non Af Amer: >60 mL/min (ref >60)
Glucose, Bld: 132 mg/dL — ABNORMAL HIGH (ref 70–99)
Potassium: 3.4 mmol/L — ABNORMAL LOW (ref 3.5–5.1)
Sodium: 140 mmol/L (ref 135–145)
Total Bilirubin: 1.4 mg/dL — ABNORMAL HIGH (ref 0.3–1.2)
Total Protein: 8.4 g/dL — ABNORMAL HIGH (ref 6.5–8.1)

## 2019-07-20 LAB — CBC WITH DIFFERENTIAL/PLATELET
Abs Immature Granulocytes: 0.01 10*3/uL (ref 0.00–0.07)
Basophils Absolute: 0 10*3/uL (ref 0.0–0.1)
Basophils Relative: 0 %
Eosinophils Absolute: 0 10*3/uL (ref 0.0–0.5)
Eosinophils Relative: 0 %
HCT: 45.8 % (ref 39.0–52.0)
Hemoglobin: 14.9 g/dL (ref 13.0–17.0)
Immature Granulocytes: 0 %
Lymphocytes Relative: 15 %
Lymphs Abs: 1.1 10*3/uL (ref 0.7–4.0)
MCH: 29.2 pg (ref 26.0–34.0)
MCHC: 32.5 g/dL (ref 30.0–36.0)
MCV: 89.8 fL (ref 80.0–100.0)
Monocytes Absolute: 0.6 10*3/uL (ref 0.1–1.0)
Monocytes Relative: 8 %
Neutro Abs: 5.7 10*3/uL (ref 1.7–7.7)
Neutrophils Relative %: 77 %
Platelets: 296 10*3/uL (ref 150–400)
RBC: 5.1 MIL/uL (ref 4.22–5.81)
RDW: 13.9 % (ref 11.5–15.5)
WBC: 7.5 10*3/uL (ref 4.0–10.5)
nRBC: 0 % (ref 0.0–0.2)

## 2019-07-20 LAB — RAPID URINE DRUG SCREEN, HOSP PERFORMED
Amphetamines: NOT DETECTED
Barbiturates: NOT DETECTED
Benzodiazepines: NOT DETECTED
Cocaine: NOT DETECTED
Opiates: NOT DETECTED
Tetrahydrocannabinol: POSITIVE — AB

## 2019-07-20 LAB — RESPIRATORY PANEL BY RT PCR (FLU A&B, COVID)
Influenza A by PCR: NEGATIVE
Influenza B by PCR: NEGATIVE
SARS Coronavirus 2 by RT PCR: NEGATIVE

## 2019-07-20 LAB — SALICYLATE LEVEL: Salicylate Lvl: <7 mg/dL — ABNORMAL LOW (ref 7.0–30.0)

## 2019-07-20 LAB — ETHANOL: Alcohol, Ethyl (B): <10 mg/dL (ref <10)

## 2019-07-20 LAB — ACETAMINOPHEN LEVEL: Acetaminophen (Tylenol), Serum: <10 ug/mL — ABNORMAL LOW (ref 10–30)

## 2019-07-20 MED ORDER — NICOTINE 21 MG/24HR TD PT24: 21.0000 mg | MEDICATED_PATCH | Freq: Every day | TRANSDERMAL | Status: DC

## 2019-07-20 MED ORDER — LORAZEPAM 2 MG/ML IJ SOLN
2.0000 mg | Freq: Once | INTRAMUSCULAR | Status: AC | PRN
  Administered 2019-07-20: 2 mg via INTRAVENOUS
  Filled 2019-07-20: qty 1

## 2019-07-20 MED ORDER — ALUM & MAG HYDROXIDE-SIMETH 200-200-20 MG/5ML PO SUSP: 30.0000 mL | Freq: Four times a day (QID) | ORAL | Status: DC | PRN

## 2019-07-20 MED ORDER — ONDANSETRON HCL 4 MG PO TABS: 4.0000 mg | ORAL_TABLET | Freq: Three times a day (TID) | ORAL | Status: DC | PRN

## 2019-07-20 NOTE — ED Notes (Addendum)
RN attempted to wake patient up for TTS consult but patient falls back asleep immediately. Will call [place] when patient is more alert for assessment.

## 2019-07-20 NOTE — ED Provider Notes (Signed)
[facility] DEPT Provider Note: Georgena Spurling, MD, FACEP  CSN: [id number] MRN: [id number] ARRIVAL: 07/19/19 at [place]: RESA/RESA   CHIEF COMPLAINT  Altered Mental Status  Level 5 caveat: Altered mental status HISTORY OF PRESENT ILLNESS  07/20/19 11:55 PM [facility] is a 44 y.o. male who was brought by EMS after he was found agitated, tearing up his apartment and damaging property.  A neighbor called police.  He was attempting to fight the police and was hitting the police car window so he was physically restrained.  Police found him to be speaking in an agitated manner, forming coherent sentences but making no overall sense.  It is not known if he has a psychiatric history.  It is not known if he has been using alcohol or drugs.   History reviewed. No pertinent past medical history.  History reviewed. No pertinent surgical history.  No family history on file.  Social History   Tobacco Use  . Smoking status: Not on file  Substance Use Topics  . Alcohol use: Not on file  . Drug use: Not on file    Prior to Admission medications   Not on File    Allergies Patient has no allergy information on record.   REVIEW OF SYSTEMS  Negative except as noted here or in the History of Present Illness.   PHYSICAL EXAMINATION  Initial Vital Signs Blood pressure (!) 161/144, pulse (!) 117, temperature 98.6 F (37 C), temperature source Oral, resp. rate (!) 28, height 5\' 10"  (1.778 m), weight 77.1 kg, SpO2 99 %.  Examination General: Well-developed, well-nourished male in no acute distress; appearance consistent with age of record HENT: normocephalic; atraumatic; dry lips with what appears to be dried blood in the mouth Eyes: pupils equal, round and reactive to light Neck: supple Heart: regular rate and rhythm; tachycardia Lungs: clear to auscultation bilaterally Abdomen: soft; nondistended; nontender; no masses or hepatosplenomegaly; bowel sounds present Extremities:  No deformity; full range of motion; pulses normal Neurologic: Awake, alert, agitated; motor function intact in all extremities and symmetric; no facial droop Skin: Warm and dry; abrasions to dorsal hands bilaterally Psychiatric: Pressured speech with normal sentence structure but no coherent narration; minimally responsive to questions or verbal stimuli   RESULTS  Summary of this visit's results, reviewed and interpreted by myself:   EKG Interpretation  Date/Time:  Friday July 20 2019 00:09:25 EST Ventricular Rate:  107 PR Interval:    QRS Duration: 78 QT Interval:  339 QTC Calculation: 453 R Axis:   84 Text Interpretation: Fast sinus arrhythmia Biatrial enlargement Minimal ST depression, inferior leads No previous ECGs available Confirmed by Shanon Rosser [phone]) on 07/20/2019 1:44:11 AM      Laboratory Studies: Results for orders placed or performed during the hospital encounter of 07/19/19 (from the past 24 hour(s))  Comprehensive metabolic panel     Status: Abnormal   Collection Time: 07/20/19 12:23 AM  Result Value Ref Range   Sodium 140 135 - 145 mmol/L   Potassium 3.4 (L) 3.5 - 5.1 mmol/L   Chloride 104 98 - 111 mmol/L   CO2 19 (L) 22 - 32 mmol/L   Glucose, Bld 132 (H) 70 - 99 mg/dL   BUN 18 6 - 20 mg/dL   Creatinine, Ser 1.24 0.61 - 1.24 mg/dL   Calcium 9.7 8.9 - 10.3 mg/dL   Total Protein 8.4 (H) 6.5 - 8.1 g/dL   Albumin 4.9 3.5 - 5.0 g/dL   AST 43 (H) 15 -  41 U/L   ALT 28 0 - 44 U/L   Alkaline Phosphatase 49 38 - 126 U/L   Total Bilirubin 1.4 (H) 0.3 - 1.2 mg/dL   GFR calc non Af Amer >60 >60 mL/min   GFR calc Af Amer >60 >60 mL/min   Anion gap 17 (H) 5 - 15  Ethanol     Status: None   Collection Time: 07/20/19 12:23 AM  Result Value Ref Range   Alcohol, Ethyl (B) <10 <10 mg/dL  Salicylate level     Status: Abnormal   Collection Time: 07/20/19 12:23 AM  Result Value Ref Range   Salicylate Lvl <7.0 (L) 7.0 - 30.0 mg/dL  Acetaminophen level     Status:  Abnormal   Collection Time: 07/20/19 12:23 AM  Result Value Ref Range   Acetaminophen (Tylenol), Serum <10 (L) 10 - 30 ug/mL  Rapid urine drug screen (hospital performed)     Status: Abnormal   Collection Time: 07/20/19 12:23 AM  Result Value Ref Range   Opiates NONE DETECTED NONE DETECTED   Cocaine NONE DETECTED NONE DETECTED   Benzodiazepines NONE DETECTED NONE DETECTED   Amphetamines NONE DETECTED NONE DETECTED   Tetrahydrocannabinol POSITIVE (A) NONE DETECTED   Barbiturates NONE DETECTED NONE DETECTED  CBC with Differential/Platelet     Status: None   Collection Time: 07/20/19 12:23 AM  Result Value Ref Range   WBC 7.5 4.0 - 10.5 K/uL   RBC 5.10 4.22 - 5.81 MIL/uL   Hemoglobin 14.9 13.0 - 17.0 g/dL   HCT 95.2 84.1 - 32.4 %   MCV 89.8 80.0 - 100.0 fL   MCH 29.2 26.0 - 34.0 pg   MCHC 32.5 30.0 - 36.0 g/dL   RDW 40.1 02.7 - 25.3 %   Platelets 296 150 - 400 K/uL   nRBC 0.0 0.0 - 0.2 %   Neutrophils Relative % 77 %   Neutro Abs 5.7 1.7 - 7.7 K/uL   Lymphocytes Relative 15 %   Lymphs Abs 1.1 0.7 - 4.0 K/uL   Monocytes Relative 8 %   Monocytes Absolute 0.6 0.1 - 1.0 K/uL   Eosinophils Relative 0 %   Eosinophils Absolute 0.0 0.0 - 0.5 K/uL   Basophils Relative 0 %   Basophils Absolute 0.0 0.0 - 0.1 K/uL   Immature Granulocytes 0 %   Abs Immature Granulocytes 0.01 0.00 - 0.07 K/uL  Respiratory Panel by RT PCR (Flu A&B, Covid) - Nasopharyngeal Swab     Status: None   Collection Time: 07/20/19  1:26 AM   Specimen: Nasopharyngeal Swab  Result Value Ref Range   SARS Coronavirus 2 by RT PCR NEGATIVE NEGATIVE   Influenza A by PCR NEGATIVE NEGATIVE   Influenza B by PCR NEGATIVE NEGATIVE   Imaging Studies: No results found.  ED COURSE and MDM  Nursing notes, initial and subsequent vitals signs, including pulse oximetry, reviewed and interpreted by myself.  Vitals:   07/20/19 0445 07/20/19 0500 07/20/19 0515 07/20/19 0530  BP:  114/87  115/86  Pulse: 97 90 95 93  Resp: 15 15  16 16   Temp:      TempSrc:      SpO2: 100% 96% 96% 96%  Weight:      Height:       Medications  ondansetron (ZOFRAN) tablet 4 mg (has no administration in time range)  alum & mag hydroxide-simeth (MAALOX/MYLANTA) 200-200-20 MG/5ML suspension 30 mL (has no administration in time range)  ziprasidone (GEODON) injection 20 mg (20  mg Intramuscular Given 07/20/19 0005)  LORazepam (ATIVAN) injection 2 mg (2 mg Intravenous Given 07/20/19 0446)   12:18 AM Patient still agitated despite Geodon 20 mg IM.  Urine obtained by in and out cath.  12:44 AM Review of old records, in a chart marked for merge, reveals the patient has a history of depression and anxiety and presented with a similar psychotic episode in September 2019.  He was treated at [facility].  IVC papers being completed by police officers.  1:18 AM Patient sleeping comfortably.   4:58 AM Patient now awake and alert.  He continues to be paranoid and believes there is a person out to get him but he is able to speak in coherent narrative albeit containing fantastic elements.  We will have him assessed by TTS.  PROCEDURES  Procedures   ED DIAGNOSES     ICD-10-CM   1. Psychosis, unspecified psychosis type (HCC)  F29   2. Cannabis abuse  F12.10        Ercil Cassis, Jonny Ruiz, MD 07/20/19 [phone]

## 2019-07-20 NOTE — Consult Note (Signed)
Telepsych Consultation   Reason for Consult:  Evaluate aggressive behaviors and suicidal ideations Referring Physician:  EDP Location of Patient: [facility] Location of Provider: [facility]  Patient Identification: [facility] MRN:  [id number] Principal Diagnosis: <principal problem not specified> Diagnosis:  Active Problems:   Substance induced mood disorder (HCC)   Total Time spent with patient: 30 minutes Tele Assessment  [name], 44 y.o., male patient presented to [facility] involuntarily for evaluation after damaging property and making suicidal ideations in the setting of marijuana use.  Patient seen via telepsych by this provider; chart reviewed and consulted with Dr. Lucianne Muss on 07/20/19.  On evaluation [name] reports he is doing better today. Per review of nursing notes he has not required restraints today, and has appropriate interactions with staff.    During evaluation [name] is sitting upright in bed eating breakfast.  He is alert/oriented x 4; calm/cooperative; and mood congruent with affect.  Patient is speaking in a clear tone at moderate volume, and normal pace; with good eye contact.  His thought process is coherent and relevant; There is no indication that he is currently responding to internal/external stimuli or experiencing delusional thought content.  Patient denies suicidal/self-harm/homicidal ideation, psychosis, and paranoia.  Patient has remained calm throughout assessment and has answered questions appropriately although he fell asleep several times during assessment and required frequent prompting to wake up.      Past Psychiatric History: No known psychiatric history  Risk to Self: Suicidal Ideation: No Suicidal Intent: No Is patient at risk for suicide?: No Suicidal Plan?: No Specify Current Suicidal Plan: (n/a) Access to Means: Yes Specify Access to Suicidal Means: ("I have a firearm in my truck") What  has been your use of drugs/alcohol within the last 12 months?: (Marijuana) How many times?: (0) Other Self Harm Risks: (denies ) Triggers for Past Attempts: Other (Comment)(n/a) Intentional Self Injurious Behavior: None Risk to Others: Homicidal Ideation: No Thoughts of Harm to Others: No Current Homicidal Intent: No Current Homicidal Plan: No Access to Homicidal Means: No Identified Victim: (n/a) History of harm to others?: No Assessment of Violence: In distant past(none reported) Violent Behavior Description: (patient reports violent behaviors as a adolescent ) Does patient have access to weapons?: Yes (Comment) Criminal Charges Pending?: No Does patient have a court date: No Prior Inpatient Therapy: Prior Inpatient Therapy: No Prior Outpatient Therapy: Prior Outpatient Therapy: No Does patient have an ACCT team?: No Does patient have Intensive In-House Services?  : No Does patient have [facility] services? : No Does patient have P4CC services?: No  Past Medical History: History reviewed. No pertinent past medical history. History reviewed. No pertinent surgical history. Family History: No family history on file. Family Psychiatric  History: unknown Social History:  Social History   Substance and Sexual Activity  Alcohol Use None     Social History   Substance and Sexual Activity  Drug Use Not on file    Social History   Socioeconomic History  . Marital status: Single    Spouse name: Not on file  . Number of children: Not on file  . Years of education: Not on file  . Highest education level: Not on file  Occupational History  . Not on file  Tobacco Use  . Smoking status: Not on file  Substance and Sexual Activity  . Alcohol use: Not on file  . Drug use: Not on file  . Sexual activity: Not on file  Other Topics  Concern  . Not on file  Social History Narrative  . Not on file   Social Determinants of Health   Financial Resource Strain:   . Difficulty of  Paying Living Expenses: Not on file  Food Insecurity:   . Worried About Programme researcher, broadcasting/film/video in the Last Year: Not on file  . Ran Out of Food in the Last Year: Not on file  Transportation Needs:   . Lack of Transportation (Medical): Not on file  . Lack of Transportation (Non-Medical): Not on file  Physical Activity:   . Days of Exercise per Week: Not on file  . Minutes of Exercise per Session: Not on file  Stress:   . Feeling of Stress : Not on file  Social Connections:   . Frequency of Communication with Friends and Family: Not on file  . Frequency of Social Gatherings with Friends and Family: Not on file  . Attends Religious Services: Not on file  . Active Member of Clubs or Organizations: Not on file  . Attends Banker Meetings: Not on file  . Marital Status: Not on file   Additional Social History:    Allergies:  Not on File  Labs:  Results for orders placed or performed during the hospital encounter of 07/19/19 (from the past 48 hour(s))  Comprehensive metabolic panel     Status: Abnormal   Collection Time: 07/20/19 12:23 AM  Result Value Ref Range   Sodium 140 135 - 145 mmol/L   Potassium 3.4 (L) 3.5 - 5.1 mmol/L   Chloride 104 98 - 111 mmol/L   CO2 19 (L) 22 - 32 mmol/L   Glucose, Bld 132 (H) 70 - 99 mg/dL   BUN 18 6 - 20 mg/dL   Creatinine, Ser 5.97 0.61 - 1.24 mg/dL   Calcium 9.7 8.9 - 41.6 mg/dL   Total Protein 8.4 (H) 6.5 - 8.1 g/dL   Albumin 4.9 3.5 - 5.0 g/dL   AST 43 (H) 15 - 41 U/L   ALT 28 0 - 44 U/L   Alkaline Phosphatase 49 38 - 126 U/L   Total Bilirubin 1.4 (H) 0.3 - 1.2 mg/dL   GFR calc non Af Amer >60 >60 mL/min   GFR calc Af Amer >60 >60 mL/min   Anion gap 17 (H) 5 - 15    Comment: Performed at [facility], [address], [place], [place] [address]  Ethanol     Status: None   Collection Time: 07/20/19 12:23 AM  Result Value Ref Range   Alcohol, Ethyl (B) <10 <10 mg/dL    Comment: (NOTE) Lowest detectable limit  for serum alcohol is 10 mg/dL. For medical purposes only. Performed at [facility], [address] [place], [place] [address]   Salicylate level     Status: Abnormal   Collection Time: 07/20/19 12:23 AM  Result Value Ref Range   Salicylate Lvl <7.0 (L) 7.0 - 30.0 mg/dL    Comment: Performed at [facility], [address] [place], [place] [address]  Acetaminophen level     Status: Abnormal   Collection Time: 07/20/19 12:23 AM  Result Value Ref Range   Acetaminophen (Tylenol), Serum <10 (L) 10 - 30 ug/mL    Comment: (NOTE) Therapeutic concentrations vary significantly. A range of 10-30 ug/mL  may be an effective concentration for many patients. However, some  are best treated at concentrations outside of this range. Acetaminophen concentrations >150 ug/mL at 4 hours after  ingestion  and >50 ug/mL at 12 hours after ingestion are often associated with  toxic reactions. Performed at [facility], [address] [place], [place] [address]   Rapid urine drug screen (hospital performed)     Status: Abnormal   Collection Time: 07/20/19 12:23 AM  Result Value Ref Range   Opiates NONE DETECTED NONE DETECTED   Cocaine NONE DETECTED NONE DETECTED   Benzodiazepines NONE DETECTED NONE DETECTED   Amphetamines NONE DETECTED NONE DETECTED   Tetrahydrocannabinol POSITIVE (A) NONE DETECTED   Barbiturates NONE DETECTED NONE DETECTED    Comment: (NOTE) DRUG SCREEN FOR MEDICAL PURPOSES ONLY.  IF CONFIRMATION IS NEEDED FOR ANY PURPOSE, NOTIFY LAB WITHIN 5 DAYS. LOWEST DETECTABLE LIMITS FOR URINE DRUG SCREEN Drug Class                     Cutoff (ng/mL) Amphetamine and metabolites    1000 Barbiturate and metabolites    200 Benzodiazepine                 200 Tricyclics and metabolites     300 Opiates and metabolites        300 Cocaine and metabolites        300 THC                            50 Performed at [facility], [address] [place], [place] [address]   CBC with Differential/Platelet     Status: None   Collection Time: 07/20/19 12:23 AM  Result Value Ref Range   WBC 7.5 4.0 - 10.5 K/uL   RBC 5.10 4.22 - 5.81 MIL/uL   Hemoglobin 14.9 13.0 - 17.0 g/dL   HCT 09.8 11.9 - 14.7 %   MCV 89.8 80.0 - 100.0 fL   MCH 29.2 26.0 - 34.0 pg   MCHC 32.5 30.0 - 36.0 g/dL   RDW 82.9 56.2 - 13.0 %   Platelets 296 150 - 400 K/uL   nRBC 0.0 0.0 - 0.2 %   Neutrophils Relative % 77 %   Neutro Abs 5.7 1.7 - 7.7 K/uL   Lymphocytes Relative 15 %   Lymphs Abs 1.1 0.7 - 4.0 K/uL   Monocytes Relative 8 %   Monocytes Absolute 0.6 0.1 - 1.0 K/uL   Eosinophils Relative 0 %   Eosinophils Absolute 0.0 0.0 - 0.5 K/uL   Basophils Relative 0 %   Basophils Absolute 0.0 0.0 - 0.1 K/uL   Immature Granulocytes 0 %   Abs Immature Granulocytes 0.01 0.00 - 0.07 K/uL    Comment: Performed at [facility], [address], [place], [place] [address]  Respiratory Panel by RT PCR (Flu A&B, Covid) - Nasopharyngeal Swab     Status: None   Collection Time: 07/20/19  1:26 AM   Specimen: Nasopharyngeal Swab  Result Value Ref Range   SARS Coronavirus 2 by RT PCR NEGATIVE NEGATIVE    Comment: (NOTE) SARS-CoV-2 target nucleic acids are NOT DETECTED. The SARS-CoV-2 RNA is generally detectable in upper respiratoy specimens during the acute phase of infection. The lowest concentration of SARS-CoV-2 viral copies this assay can detect is 131 copies/mL. A negative result does not preclude SARS-Cov-2 infection and should not be used as the sole basis for treatment or other patient management decisions. A negative result may occur with  improper specimen collection/handling, submission of specimen other than nasopharyngeal swab, presence  of viral mutation(s) within the areas targeted by this assay, and inadequate number of viral copies (<131 copies/mL). A negative result must be combined with  clinical observations, patient history, and epidemiological information. The expected result is Negative. Fact Sheet for Patients:  PinkCheek.be Fact Sheet for Healthcare Providers:  GravelBags.it This test is not yet ap proved or cleared by the [place] FDA and  has been authorized for detection and/or diagnosis of SARS-CoV-2 by FDA under an Emergency Use Authorization (EUA). This EUA will remain  in effect (meaning this test can be used) for the duration of the COVID-19 declaration under Section 564(b)(1) of the Act, 21 U.S.C. section 360bbb-3(b)(1), unless the authorization is terminated or revoked sooner.    Influenza A by PCR NEGATIVE NEGATIVE   Influenza B by PCR NEGATIVE NEGATIVE    Comment: (NOTE) The Xpert Xpress SARS-CoV-2/FLU/RSV assay is intended as an aid in  the diagnosis of influenza from Nasopharyngeal swab specimens and  should not be used as a sole basis for treatment. Nasal washings and  aspirates are unacceptable for Xpert Xpress SARS-CoV-2/FLU/RSV  testing. Fact Sheet for Patients: PinkCheek.be Fact Sheet for Healthcare Providers: GravelBags.it This test is not yet approved or cleared by the [place] FDA and  has been authorized for detection and/or diagnosis of SARS-CoV-2 by  FDA under an Emergency Use Authorization (EUA). This EUA will remain  in effect (meaning this test can be used) for the duration of the  Covid-19 declaration under Section 564(b)(1) of the Act, 21  U.S.C. section 360bbb-3(b)(1), unless the authorization is  terminated or revoked. Performed at [facility], [place] [address] [place], [place] [address]     Medications:  Current Facility-Administered Medications  Medication Dose Route Frequency Provider Last Rate Last Admin  . alum & mag hydroxide-simeth (MAALOX/MYLANTA) 200-200-20 MG/5ML  suspension 30 mL  30 mL Oral Q6H PRN Molpus, John, MD      . ondansetron (ZOFRAN) tablet 4 mg  4 mg Oral Q8H PRN Molpus, John, MD       No current outpatient medications on file.    Musculoskeletal: Unable to assess via telepsych.   Psychiatric Specialty Exam: Physical Exam  Constitutional: He is oriented to person, place, and time. He appears well-developed.  HENT:  Head: Normocephalic.  Eyes: Pupils are equal, round, and reactive to light.  Cardiovascular: Normal rate.  Respiratory: Effort normal.  Musculoskeletal:        General: Normal range of motion.     Cervical back: Normal range of motion.  Neurological: He is alert and oriented to person, place, and time.  Psychiatric: He has a normal mood and affect. His behavior is normal. Judgment and thought content normal.    Review of Systems  Psychiatric/Behavioral: Negative for behavioral problems, dysphoric mood, hallucinations, self-injury and suicidal ideas. The patient is not nervous/anxious and is not hyperactive.     Blood pressure (!) 129/95, pulse 77, temperature 98.6 F (37 C), temperature source Oral, resp. rate 17, height 5\' 10"  (1.778 m), weight 77.1 kg, SpO2 100 %.Body mass index is 24.39 kg/m.  General Appearance: Casual  Eye Contact:  falling asleep during assessment  Speech:  Clear and Coherent  Volume:  Normal  Mood:  Euthymic and tired  Affect:  Congruent  Thought Process:  Coherent and Descriptions of Associations: Circumstantial  Orientation:  Full (Time, Place, and Person)  Thought Content:  Logical  Suicidal Thoughts:  states, " I never wanted to harm myself"  Homicidal Thoughts:  No  Memory:  Immediate;   Good Recent;   Good Remote;   Good  Judgement:  Good  Insight:  Good  Psychomotor Activity:  Normal  Concentration:  Concentration: Good and Attention Span: Good  Recall:  Good  Fund of Knowledge:  Good  Language:  Good  Akathisia:  Negative  Handed:  Right  AIMS (if indicated):      Assets:  Communication Skills Housing Resilience  ADL's:  Intact  Cognition:  WNL  Sleep:   >6 hours     Treatment Plan Summary: Patient was given lorazepam and Geodon for agitation and substance induced psychosis. UDS + for THC.  After resting overnight, he is more clear and coherent and no longer demonstrates aggressive behaviors or psychosis.  He denies suicidal or homicidal ideations and no longer presents with safety concerns.  Recommend he speak with peer support prior to discharge; Otherwise, he is psych cleared.     Disposition: No evidence of imminent risk to self or others at present.   Patient does not meet criteria for psychiatric inpatient admission. Supportive therapy provided about ongoing stressors. Discussed crisis plan, support from social network, calling 911, coming to the Emergency Department, and calling Suicide Hotline.  This service was provided via telemedicine using a 2-way, interactive audio and video technology.  Names of all persons participating in this telemedicine service and their role in this encounter. Name: Psalm Schappell Role: Patient  Name: Ophelia Shoulder Role: PMHNP  Name: Nelly Rout Role: Psychiatrist   Spoke with EDP; informed of above recommendation and disposition  Chales Abrahams, NP 07/20/2019 11:54 AM
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
## 2019-07-20 NOTE — ED Notes (Signed)
TTS cart at bedside and patient is awake.

## 2019-07-20 NOTE — Discharge Instructions (Signed)
To help you maintain a sober lifestyle, a substance abuse treatment program may be beneficial to you.  Contact [facility] at your earliest opportunity to ask about enrolling in their Chemical Dependency Intensive Outpatient Program: ° °     [facility] °     [address] °     [place], [place] [address] °     [phone] °

## 2019-07-20 NOTE — BH Assessment (Signed)
Contacted ED in an attempt to complete patient's TTS assessment. The nurse was unable to arouse patient. TTS asked to be contacted once patient is able to be assessed.

## 2019-07-20 NOTE — Progress Notes (Addendum)
TOC CM spoke to pt and states he needs transportation home. Pt signed waiver. Scanned to [email or web address]. Contacted and ride set up through [employer]. Provided pt with clothes. Isidoro Donning RN CCM, [facility] ED TOC CM [phone]

## 2019-07-20 NOTE — [facility] Counselor (Signed)
TTS attempted to complete assessment pt was not roused and sleeping. TTS to complete assessment when pt is able to cooperate and roused.

## 2019-07-20 NOTE — BH Assessment (Signed)
[facility] Assessment Progress Note  Per Nelly Rout, MD, this pt does not require psychiatric hospitalization at this time.  Pt presents under IVC initiated by law enforcement which Dr Lucianne Muss has rescinded.  Pt is to be discharged from [facility] with outpatient referrals.  After this writer spoke to pt, it has been determined that referral information for the Chemical Dependency Intensive Outpatient Program at the [facility] is best suited to his needs at this time.  This has been included in pt's discharge instructions.  Pt would also benefit from seeing Peer Support Specialists, and a peer support consult has been ordered for pt.  Pt's nurse, Addison Naegeli, has been notified.  Doylene Canning, MA Triage Specialist [phone]
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
## 2019-07-20 NOTE — ED Notes (Signed)
Patient speaking to TTS now.

## 2019-07-23 ENCOUNTER — Encounter (HOSPITAL_COMMUNITY): Payer: Self-pay

## 2019-07-23 ENCOUNTER — Inpatient Hospital Stay (HOSPITAL_COMMUNITY)
Admission: AD | Admit: 2019-07-23 | Discharge: 2019-07-26 | DRG: 885 | Disposition: A | Discharge status: Home / Self Care | Payer: BC Managed Care – PPO | Source: Intra-hospital | Attending: Psychiatry | Admitting: Psychiatry

## 2019-07-23 ENCOUNTER — Encounter (HOSPITAL_COMMUNITY): Payer: Self-pay | Admitting: Psychiatry

## 2019-07-23 ENCOUNTER — Other Ambulatory Visit: Payer: Self-pay

## 2019-07-23 ENCOUNTER — Emergency Department (EMERGENCY_DEPARTMENT_HOSPITAL)
Admission: EM | Admit: 2019-07-23 | Discharge: 2019-07-23 | Disposition: A | Discharge status: Other Acute Inpatient Hospital | Payer: BC Managed Care – PPO | Source: Home / Self Care | Attending: Emergency Medicine | Admitting: Emergency Medicine

## 2019-07-23 DIAGNOSIS — F333 Major depressive disorder, recurrent, severe with psychotic symptoms: Secondary | ICD-10-CM | POA: Insufficient documentation

## 2019-07-23 DIAGNOSIS — F200 Paranoid schizophrenia: Principal | ICD-10-CM

## 2019-07-23 DIAGNOSIS — G4700 Insomnia, unspecified: Secondary | ICD-10-CM | POA: Diagnosis present

## 2019-07-23 DIAGNOSIS — Z20822 Contact with and (suspected) exposure to covid-19: Secondary | ICD-10-CM | POA: Insufficient documentation

## 2019-07-23 DIAGNOSIS — F1220 Cannabis dependence, uncomplicated: Secondary | ICD-10-CM | POA: Diagnosis present

## 2019-07-23 DIAGNOSIS — Z87891 Personal history of nicotine dependence: Secondary | ICD-10-CM

## 2019-07-23 DIAGNOSIS — F12988 Cannabis use, unspecified with other cannabis-induced disorder: Secondary | ICD-10-CM | POA: Diagnosis not present

## 2019-07-23 DIAGNOSIS — F332 Major depressive disorder, recurrent severe without psychotic features: Secondary | ICD-10-CM | POA: Diagnosis not present

## 2019-07-23 DIAGNOSIS — F1229 Cannabis dependence with unspecified cannabis-induced disorder: Secondary | ICD-10-CM | POA: Insufficient documentation

## 2019-07-23 DIAGNOSIS — Z79899 Other long term (current) drug therapy: Secondary | ICD-10-CM | POA: Insufficient documentation

## 2019-07-23 DIAGNOSIS — F418 Other specified anxiety disorders: Secondary | ICD-10-CM

## 2019-07-23 DIAGNOSIS — F3010 Manic episode without psychotic symptoms, unspecified: Secondary | ICD-10-CM | POA: Diagnosis not present

## 2019-07-23 DIAGNOSIS — F29 Unspecified psychosis not due to a substance or known physiological condition: Secondary | ICD-10-CM | POA: Diagnosis present

## 2019-07-23 LAB — CBC
HCT: 45.7 % (ref 39.0–52.0)
Hemoglobin: 14.8 g/dL (ref 13.0–17.0)
MCH: 29.7 pg (ref 26.0–34.0)
MCHC: 32.4 g/dL (ref 30.0–36.0)
MCV: 91.8 fL (ref 80.0–100.0)
Platelets: 286 10*3/uL (ref 150–400)
RBC: 4.98 MIL/uL (ref 4.22–5.81)
RDW: 13.8 % (ref 11.5–15.5)
WBC: 9.4 10*3/uL (ref 4.0–10.5)
nRBC: 0 % (ref 0.0–0.2)

## 2019-07-23 LAB — COMPREHENSIVE METABOLIC PANEL
ALT: 37 U/L (ref 0–44)
AST: 52 U/L — ABNORMAL HIGH (ref 15–41)
Albumin: 4.4 g/dL (ref 3.5–5.0)
Alkaline Phosphatase: 46 U/L (ref 38–126)
Anion gap: 10 (ref 5–15)
BUN: 11 mg/dL (ref 6–20)
CO2: 25 mmol/L (ref 22–32)
Calcium: 9.4 mg/dL (ref 8.9–10.3)
Chloride: 103 mmol/L (ref 98–111)
Creatinine, Ser: 0.78 mg/dL (ref 0.61–1.24)
GFR calc Af Amer: >60 mL/min (ref >60)
GFR calc non Af Amer: >60 mL/min (ref >60)
Glucose, Bld: 96 mg/dL (ref 70–99)
Potassium: 3.7 mmol/L (ref 3.5–5.1)
Sodium: 138 mmol/L (ref 135–145)
Total Bilirubin: 1.3 mg/dL — ABNORMAL HIGH (ref 0.3–1.2)
Total Protein: 8.1 g/dL (ref 6.5–8.1)

## 2019-07-23 LAB — RAPID URINE DRUG SCREEN, HOSP PERFORMED
Amphetamines: NOT DETECTED
Barbiturates: NOT DETECTED
Benzodiazepines: NOT DETECTED
Cocaine: NOT DETECTED
Opiates: NOT DETECTED
Tetrahydrocannabinol: POSITIVE — AB

## 2019-07-23 LAB — ACETAMINOPHEN LEVEL: Acetaminophen (Tylenol), Serum: <10 ug/mL — ABNORMAL LOW (ref 10–30)

## 2019-07-23 LAB — ETHANOL: Alcohol, Ethyl (B): <10 mg/dL (ref <10)

## 2019-07-23 LAB — RESPIRATORY PANEL BY RT PCR (FLU A&B, COVID)
Influenza A by PCR: NEGATIVE
Influenza B by PCR: NEGATIVE
SARS Coronavirus 2 by RT PCR: NEGATIVE

## 2019-07-23 LAB — SALICYLATE LEVEL: Salicylate Lvl: <7 mg/dL — ABNORMAL LOW (ref 7.0–30.0)

## 2019-07-23 MED ORDER — OLANZAPINE 5 MG PO TBDP
5.0000 mg | ORAL_TABLET | Freq: Three times a day (TID) | ORAL | Status: DC | PRN
  Filled 2019-07-23: qty 1

## 2019-07-23 MED ORDER — ACETAMINOPHEN 325 MG PO TABS: 650.0000 mg | ORAL_TABLET | Freq: Four times a day (QID) | ORAL | Status: DC | PRN

## 2019-07-23 MED ORDER — LORAZEPAM 1 MG PO TABS: 1.0000 mg | ORAL_TABLET | Freq: Four times a day (QID) | ORAL | Status: DC | PRN

## 2019-07-23 MED ORDER — ALUM & MAG HYDROXIDE-SIMETH 200-200-20 MG/5ML PO SUSP: 30.0000 mL | ORAL | Status: DC | PRN

## 2019-07-23 MED ORDER — SERTRALINE HCL 50 MG PO TABS
100.0000 mg | ORAL_TABLET | Freq: Every day | ORAL | Status: DC
  Administered 2019-07-23: 100 mg via ORAL
  Filled 2019-07-23: qty 2

## 2019-07-23 MED ORDER — MAGNESIUM HYDROXIDE 400 MG/5ML PO SUSP: 30.0000 mL | Freq: Every day | ORAL | Status: DC | PRN

## 2019-07-23 MED ORDER — LORAZEPAM 1 MG PO TABS
1.0000 mg | ORAL_TABLET | ORAL | Status: AC | PRN
  Administered 2019-07-23: 1 mg via ORAL
  Filled 2019-07-23: qty 1

## 2019-07-23 MED ORDER — OLANZAPINE 5 MG PO TBDP
5.0000 mg | ORAL_TABLET | Freq: Every day | ORAL | Status: DC
  Administered 2019-07-23: 5 mg via ORAL
  Filled 2019-07-23: qty 1

## 2019-07-23 MED ORDER — HYDROXYZINE HCL 25 MG PO TABS
25.0000 mg | ORAL_TABLET | Freq: Three times a day (TID) | ORAL | Status: DC | PRN
  Administered 2019-07-23: 25 mg via ORAL
  Filled 2019-07-23: qty 1

## 2019-07-23 MED ORDER — ARIPIPRAZOLE 10 MG PO TABS
10.0000 mg | ORAL_TABLET | Freq: Every day | ORAL | Status: DC
  Administered 2019-07-23: 10 mg via ORAL
  Filled 2019-07-23 (×3): qty 1

## 2019-07-23 MED ORDER — ZIPRASIDONE MESYLATE 20 MG IM SOLR: 20.0000 mg | INTRAMUSCULAR | Status: DC | PRN

## 2019-07-23 MED ORDER — TRAZODONE HCL 50 MG PO TABS
50.0000 mg | ORAL_TABLET | Freq: Every evening | ORAL | Status: DC | PRN
  Administered 2019-07-23: 50 mg via ORAL
  Filled 2019-07-23: qty 1

## 2019-07-23 NOTE — Plan of Care (Signed)
Nurse attempted to complete the admission process but was unable to do so because of patient's actions.

## 2019-07-23 NOTE — ED Provider Notes (Signed)
[facility]-EMERGENCY DEPT Provider Note   CSN: [id number] Arrival date & time: 07/23/19  0255     History Chief Complaint  Patient presents with  . Manic Behavior    Eric Baldwin is a 44 y.o. male.  Patient to ED, manic behavior on arrival, difficult to determine reason for visit. He reports he is unable to sleep and has been awake for days. He states he needs a work note "but not til Tuesday". He reports he was recently 'saved' and quotes Bible scripture. He denies pain, fever, SI, HI. On review of the medical record, patient seen a few days ago in this ED for evaluation of psychotic behavior, treated and released after [facility]/psychiatric consultation.   The history is provided by the patient. No language interpreter was used.       History reviewed. No pertinent past medical history.  Patient Active Problem List   Diagnosis Date Noted  . Major depressive disorder, recurrent episode, severe with mixed features (HCC) 02/09/2018  . Screen for STD (sexually transmitted disease) 10/26/2016  . Dysuria 10/26/2016    History reviewed. No pertinent surgical history.     Family History  Problem Relation Age of Onset  . Heart disease Father     Social History   Tobacco Use  . Smoking status: Former Smoker    Types: E-cigarettes  . Smokeless tobacco: Never Used  Substance Use Topics  . Alcohol use: Yes    Alcohol/week: 10.0 standard drinks    Types: 5 Cans of beer, 5 Shots of liquor per week  . Drug use: No    Home Medications Prior to Admission medications   Medication Sig Start Date End Date Taking? Authorizing Provider  sertraline (ZOLOFT) 100 MG tablet TAKE 1 TABLET BY MOUTH DAILY 06/12/19   Shade Flood, MD  sertraline (ZOLOFT) 50 MG tablet TAKE 1 TABLET BY MOUTH DAILY(WITH 100 MG TABLET for 150mg  QD) 01/22/19   01/24/19, MD    Allergies    Mango flavor  Review of Systems   Review of Systems  Unable to perform ROS:  Psychiatric disorder    Physical Exam Updated Vital Signs BP 137/89   Pulse 97   Temp 97.8 F (36.6 C) (Oral)   Resp 18   SpO2 98%   Physical Exam Constitutional:      Appearance: He is well-developed.  HENT:     Head: Normocephalic.  Cardiovascular:     Rate and Rhythm: Normal rate and regular rhythm.  Pulmonary:     Effort: Pulmonary effort is normal.     Breath sounds: Normal breath sounds.  Abdominal:     General: Bowel sounds are normal.     Palpations: Abdomen is soft.     Tenderness: There is no abdominal tenderness. There is no guarding or rebound.  Musculoskeletal:        General: Normal range of motion.     Cervical back: Normal range of motion and neck supple.  Skin:    General: Skin is warm and dry.     Findings: No rash.  Neurological:     Mental Status: He is alert.  Psychiatric:        Mood and Affect: Mood is elated. Affect is labile.        Speech: Speech is rapid and pressured and tangential.        Behavior: Behavior is hyperactive.     ED Results / Procedures / Treatments   Labs (all labs  ordered are listed, but only abnormal results are displayed) Labs Reviewed  RAPID URINE DRUG SCREEN, HOSP PERFORMED - Abnormal; Notable for the following components:      Result Value   Tetrahydrocannabinol POSITIVE (*)    All other components within normal limits  CBC  COMPREHENSIVE METABOLIC PANEL  ETHANOL  SALICYLATE LEVEL  ACETAMINOPHEN LEVEL    EKG None  Radiology No results found.  Procedures Procedures (including critical care time)  Medications Ordered in ED Medications - No data to display  ED Course  I have reviewed the triage vital signs and the nursing notes.  Pertinent labs & imaging results that were available during my care of the patient were reviewed by me and considered in my medical decision making (see chart for details).    MDM Rules/Calculators/A&P                      Patient to ED with behavior c/w mania. He is  pleasant, cooperative. Seen recently for agitation, violent behavior that resolved with Geodon in the ED.   TTS consultation will be required to determine appropriate disposition.   The patient is considered medically cleared for psychiatric evaluation and possible placement.   Final Clinical Impression(s) / ED Diagnoses Final diagnoses:  None   1. Mania  Rx / DC Orders ED Discharge Orders    None       [name] 45/85/92 [id number]    Delora Fuel, MD 62/86/38 [phone]

## 2019-07-23 NOTE — ED Triage Notes (Signed)
Pt presents with manic behavior. Denies SI, but states that he wants to sleep and wants "a work note to be out of work until March 10 starting tomorrow night because he only needs a couple hundred dollars." Ambulatory and compliant at this time.

## 2019-07-23 NOTE — ED Notes (Signed)
Pt ambulatory to bathroom, no assistance needed.  

## 2019-07-23 NOTE — BH Assessment (Addendum)
[facility] Assessment Progress Note  Per Shuvon Rankin, FNP, this pt requires psychiatric hospitalization at this time.  Jasmine has assigned pt to [facility] Rm 304-2; [facility] will be ready to receive pt at 15:00.  Pt's nurse, Kendal Hymen, has been notified, and agrees to have pt sign Voluntary Admission and Consent for Treatment, the original of which she will send along with pt via [employer].  She will call report to [phone].  Doylene Canning, [place] [facility] Coordinator [phone]

## 2019-07-23 NOTE — Consult Note (Signed)
Telepsych Consultation   Reason for Consult:  Manic behavior Referring Physician:  Lucien Mons EDP  Location of Patient: [facility] Location of Provider: [facility]  Patient Identification: Eric Baldwin MRN:  [id number] Principal Diagnosis: Cannabis-induced mood disorder (HCC) Diagnosis:  Principal Problem:   Cannabis-induced mood disorder (HCC) Active Problems:   Major depressive disorder, recurrent episode, severe with mixed features (HCC)   Cannabis use disorder, moderate, in controlled environment (HCC)   Total Time spent with patient: 30 minutes  Subjective:   Per TTS Assessment Note; reviewed by this provider:  Russ Baldwin is an 44 y.o. male. Per EDP report:Patient to ED, manic behavior on arrival, difficult to determine reason for visit. He reports he is unable to sleep and has been awake for days. He states he needs a work note "but not til Tuesday". He reports he was recently 'saved' and quotes Bible scripture. He denies pain, fever, SI, HI. On review of the medical record, patient seen a few days ago in this ED for evaluation of psychotic behavior, treated and released after [facility]/psychiatric consultation.TTS:  Pt presents very manic during the assessment and a bit confused with tangential speech. Pt states he is at the ED because, " I need to get some rest until I have to get to work ". Pt repeatedly states that he has to get rest for work, and that he needs to speak with his boss about his next work moves. Pt becomes fixated on talking about work and rest for most of the assessment. Pt denies SI, HI and AVH. Pt denies previous SI attempts.  Pt was poor historian. Pt had difficulty answering the questions in the assessment. The Pt was hyper-religious, states he was just saved earlier today at his church called [employer]. The Pt's speech was tangential and had flight of ideas. Pt denies previous inpatient treatment. Pt states he is receiving depression medication from his PCP,  states he is taking sertraline. Pt denies any current drug or alcohol use  But current UDS positive for marijuana. Pt BAL currently pending. Pt did not present to be responding to internal stimuli or delusional content but presented with manic behavior. Pt could not provide any collateral contact at this time, pt states he lost his phone prior to arriving at ED.   HPI:  Eric Baldwin, 44 y.o., male patient seen via tele psych by this provider, consulted with Dr. Jola Babinski; and chart reviewed on 07/23/19.  On evaluation Terryl Tubb reports he has had no sleep for 3 days.  "I went to church and heard my mom whispering in my ear.  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  Called the [employer]."  Patient from topic to topic, pressured speech, disorganized, restless.  States that God told him to come to the hospital.  "For every question you asked you have to pay. That's 2 dollars, 3, 10, I work for [employer], been working there for 20 years."  Patient reports psychiatric history and outpatient psychiatric services but unable to tell who services are with and that he takes Zoloft and last time took was Sunday but unsure of dosage.  Patient denies suicidal/self-harm/homicidal ideation; but stats that he is hearing voices from the church "cause I got saved." Patient is aware that he is at the hospital, also aware of city, state, county, and country.    During evaluation Eric Baldwin is alert/oriented x 4; calm/cooperative; and mood is congruent with affect.  He/She does not appear to be  responding to internal/external stimuli or delusional thoughts.  Patient denies suicidal/self-harm/homicidal ideation, psychosis, and paranoia.  Patient answered question appropriately.     Past Psychiatric History: Major depression  Risk to Self: Suicidal Ideation: No Suicidal Intent: No Is patient at risk for suicide?: No Suicidal Plan?: No Access to Means: No What has been your use of drugs/alcohol within the  last 12 months?: none(pt denied) How many times?: 0 Other Self Harm Risks: (UTA) Triggers for Past Attempts: Unknown Intentional Self Injurious Behavior: None Risk to Others: Homicidal Ideation: No Thoughts of Harm to Others: No Current Homicidal Intent: No Current Homicidal Plan: No Access to Homicidal Means: No Identified Victim: none History of harm to others?: No Assessment of Violence: None Noted Violent Behavior Description: (none) Does patient have access to weapons?: No Criminal Charges Pending?: No Does patient have a court date: No Prior Inpatient Therapy: Prior Inpatient Therapy: Yes Prior Therapy Dates: (2019) Prior Therapy Facilty/Provider(s): [facility]) Reason for Treatment: mania Prior Outpatient Therapy: Prior Outpatient Therapy: (Unknown)  Past Medical History: History reviewed. No pertinent past medical history. History reviewed. No pertinent surgical history. Family History:  Family History  Problem Relation Age of Onset  . Heart disease Father    Family Psychiatric  History: Unaware Social History:  Social History   Substance and Sexual Activity  Alcohol Use Yes  . Alcohol/week: 10.0 standard drinks  . Types: 5 Cans of beer, 5 Shots of liquor per week     Social History   Substance and Sexual Activity  Drug Use No    Social History   Socioeconomic History  . Marital status: Single    Spouse name: Not on file  . Number of children: Not on file  . Years of education: Not on file  . Highest education level: Not on file  Occupational History  . Not on file  Tobacco Use  . Smoking status: Former Smoker    Types: E-cigarettes  . Smokeless tobacco: Never Used  Substance and Sexual Activity  . Alcohol use: Yes    Alcohol/week: 10.0 standard drinks    Types: 5 Cans of beer, 5 Shots of liquor per week  . Drug use: No  . Sexual activity: Not on file  Other Topics Concern  . Not on file  Social History Narrative  . Not on file   Social  Determinants of Health   Financial Resource Strain:   . Difficulty of Paying Living Expenses: Not on file  Food Insecurity:   . Worried About Programme researcher, broadcasting/film/video in the Last Year: Not on file  . Ran Out of Food in the Last Year: Not on file  Transportation Needs:   . Lack of Transportation (Medical): Not on file  . Lack of Transportation (Non-Medical): Not on file  Physical Activity:   . Days of Exercise per Week: Not on file  . Minutes of Exercise per Session: Not on file  Stress:   . Feeling of Stress : Not on file  Social Connections:   . Frequency of Communication with Friends and Family: Not on file  . Frequency of Social Gatherings with Friends and Family: Not on file  . Attends Religious Services: Not on file  . Active Member of Clubs or Organizations: Not on file  . Attends Banker Meetings: Not on file  . Marital Status: Not on file   Additional Social History:    Allergies:   Allergies  Allergen Reactions  . Mango Flavor Rash  Labs:  Results for orders placed or performed during the hospital encounter of 07/23/19 (from the past 48 hour(s))  Comprehensive metabolic panel     Status: Abnormal   Collection Time: 07/23/19  3:52 AM  Result Value Ref Range   Sodium 138 135 - 145 mmol/L   Potassium 3.7 3.5 - 5.1 mmol/L   Chloride 103 98 - 111 mmol/L   CO2 25 22 - 32 mmol/L   Glucose, Bld 96 70 - 99 mg/dL   BUN 11 6 - 20 mg/dL   Creatinine, Ser 9.16 0.61 - 1.24 mg/dL   Calcium 9.4 8.9 - 94.5 mg/dL   Total Protein 8.1 6.5 - 8.1 g/dL   Albumin 4.4 3.5 - 5.0 g/dL   AST 52 (H) 15 - 41 U/L   ALT 37 0 - 44 U/L   Alkaline Phosphatase 46 38 - 126 U/L   Total Bilirubin 1.3 (H) 0.3 - 1.2 mg/dL   GFR calc non Af Amer >60 >60 mL/min   GFR calc Af Amer >60 >60 mL/min   Anion gap 10 5 - 15    Comment: Performed at [facility], [address] [place], [place] [address]  Ethanol     Status: None   Collection Time: 07/23/19  3:52 AM   Result Value Ref Range   Alcohol, Ethyl (B) <10 <10 mg/dL    Comment: (NOTE) Lowest detectable limit for serum alcohol is 10 mg/dL. For medical purposes only. Performed at [facility], [address], [place], [place] [address]   Salicylate level     Status: Abnormal   Collection Time: 07/23/19  3:52 AM  Result Value Ref Range   Salicylate Lvl <7.0 (L) 7.0 - 30.0 mg/dL    Comment: Performed at [facility], [address], [place], [place] [address]  Acetaminophen level     Status: Abnormal   Collection Time: 07/23/19  3:52 AM  Result Value Ref Range   Acetaminophen (Tylenol), Serum <10 (L) 10 - 30 ug/mL    Comment: (NOTE) Therapeutic concentrations vary significantly. A range of 10-30 ug/mL  may be an effective concentration for many patients. However, some  are best treated at concentrations outside of this range. Acetaminophen concentrations >150 ug/mL at 4 hours after ingestion  and >50 ug/mL at 12 hours after ingestion are often associated with  toxic reactions. Performed at [facility], [address] [place], [place] [address]   cbc     Status: None   Collection Time: 07/23/19  3:52 AM  Result Value Ref Range   WBC 9.4 4.0 - 10.5 K/uL   RBC 4.98 4.22 - 5.81 MIL/uL   Hemoglobin 14.8 13.0 - 17.0 g/dL   HCT 97.9 48.0 - 16.5 %   MCV 91.8 80.0 - 100.0 fL   MCH 29.7 26.0 - 34.0 pg   MCHC 32.4 30.0 - 36.0 g/dL   RDW 53.7 48.2 - 70.7 %   Platelets 286 150 - 400 K/uL   nRBC 0.0 0.0 - 0.2 %    Comment: Performed at [facility], [address] [place], [place] [address]  Rapid urine drug screen (hospital performed)     Status: Abnormal   Collection Time: 07/23/19  3:52 AM  Result Value Ref Range   Opiates NONE DETECTED NONE DETECTED   Cocaine NONE DETECTED NONE DETECTED   Benzodiazepines NONE DETECTED NONE DETECTED   Amphetamines NONE DETECTED NONE DETECTED   Tetrahydrocannabinol POSITIVE  (  A) NONE DETECTED   Barbiturates NONE DETECTED NONE DETECTED    Comment: (NOTE) DRUG SCREEN FOR MEDICAL PURPOSES ONLY.  IF CONFIRMATION IS NEEDED FOR ANY PURPOSE, NOTIFY LAB WITHIN 5 DAYS. LOWEST DETECTABLE LIMITS FOR URINE DRUG SCREEN Drug Class                     Cutoff (ng/mL) Amphetamine and metabolites    1000 Barbiturate and metabolites    200 Benzodiazepine                 350 Tricyclics and metabolites     300 Opiates and metabolites        300 Cocaine and metabolites        300 THC                            50 Performed at [facility], [place] [address] [place], [place] [address]   Respiratory Panel by RT PCR (Flu A&B, Covid) - Nasopharyngeal Swab     Status: None   Collection Time: 07/23/19  4:53 AM   Specimen: Nasopharyngeal Swab  Result Value Ref Range   SARS Coronavirus 2 by RT PCR NEGATIVE NEGATIVE    Comment: (NOTE) SARS-CoV-2 target nucleic acids are NOT DETECTED. The SARS-CoV-2 RNA is generally detectable in upper respiratoy specimens during the acute phase of infection. The lowest concentration of SARS-CoV-2 viral copies this assay can detect is 131 copies/mL. A negative result does not preclude SARS-Cov-2 infection and should not be used as the sole basis for treatment or other patient management decisions. A negative result may occur with  improper specimen collection/handling, submission of specimen other than nasopharyngeal swab, presence of viral mutation(s) within the areas targeted by this assay, and inadequate number of viral copies (<131 copies/mL). A negative result must be combined with clinical observations, patient history, and epidemiological information. The expected result is Negative. Fact Sheet for Patients:  PinkCheek.be Fact Sheet for Healthcare Providers:  GravelBags.it This test is not yet ap proved or cleared by the [place] FDA and  has been  authorized for detection and/or diagnosis of SARS-CoV-2 by FDA under an Emergency Use Authorization (EUA). This EUA will remain  in effect (meaning this test can be used) for the duration of the COVID-19 declaration under Section 564(b)(1) of the Act, 21 U.S.C. section 360bbb-3(b)(1), unless the authorization is terminated or revoked sooner.    Influenza A by PCR NEGATIVE NEGATIVE   Influenza B by PCR NEGATIVE NEGATIVE    Comment: (NOTE) The Xpert Xpress SARS-CoV-2/FLU/RSV assay is intended as an aid in  the diagnosis of influenza from Nasopharyngeal swab specimens and  should not be used as a sole basis for treatment. Nasal washings and  aspirates are unacceptable for Xpert Xpress SARS-CoV-2/FLU/RSV  testing. Fact Sheet for Patients: PinkCheek.be Fact Sheet for Healthcare Providers: GravelBags.it This test is not yet approved or cleared by the [place] FDA and  has been authorized for detection and/or diagnosis of SARS-CoV-2 by  FDA under an Emergency Use Authorization (EUA). This EUA will remain  in effect (meaning this test can be used) for the duration of the  Covid-19 declaration under Section 564(b)(1) of the Act, 21  U.S.C. section 360bbb-3(b)(1), unless the authorization is  terminated or revoked. Performed at [facility], [place] [address], [place],  [address]     Medications:  Current Facility-Administered Medications  Medication Dose Route Frequency  Provider Last Rate Last Admin  . OLANZapine zydis (ZYPREXA) disintegrating tablet 5 mg  5 mg Oral Daily Idabell Picking B, NP      . sertraline (ZOLOFT) tablet 100 mg  100 mg Oral Daily Herron Fero B, NP       Current Outpatient Medications  Medication Sig Dispense Refill  . sertraline (ZOLOFT) 100 MG tablet TAKE 1 TABLET BY MOUTH DAILY (Patient taking differently: Take 100 mg by mouth daily. ) 90 tablet 1  . sertraline (ZOLOFT) 50 MG  tablet TAKE 1 TABLET BY MOUTH DAILY(WITH 100 MG TABLET for 150mg  QD) (Patient not taking: Reported on 07/23/2019) 90 tablet 1    Musculoskeletal: Strength & Muscle Tone: within normal limits Gait & Station: normal Patient leans: N/A  Psychiatric Specialty Exam: Physical Exam  Nursing note and vitals reviewed. Constitutional: He appears well-developed and well-nourished.  Respiratory: Effort normal.  Musculoskeletal:        General: Normal range of motion.     Cervical back: Normal range of motion.  Neurological: He is alert.  Oriented to self and place   Psychiatric: His mood appears anxious. His affect is labile. His speech is rapid and/or pressured. He is hyperactive. Cognition and memory are impaired. He expresses impulsivity. He expresses no homicidal and no suicidal ideation.    Review of Systems  Blood pressure 137/89, pulse 97, temperature 97.8 F (36.6 C), temperature source Oral, resp. rate 18, SpO2 98 %.There is no height or weight on file to calculate BMI.  General Appearance: Casual  Eye Contact:  Fair  Speech:  Clear and Coherent and Pressured  Volume:  Normal  Mood:  Euphoric  Affect:  Labile  Thought Process:  Coherent and Disorganized  Orientation:  Full (Time, Place, and Person)  Thought Content:  Hallucinations: Auditory, Rumination and Tangential  Suicidal Thoughts:  No  Homicidal Thoughts:  No  Memory:  Immediate;   Fair Recent;   Fair  Judgement:  Impaired  Insight:  Fair  Psychomotor Activity:  Restlessness  Concentration:  Concentration: Fair and Attention Span: Fair  Recall:  07/25/2019 of Knowledge:  Fair  Language:  Good  Akathisia:  No  Handed:  Right  AIMS (if indicated):     Assets:  Communication Skills Desire for Improvement Housing Social Support  ADL's:  Intact  Cognition:  Impaired,  Mild  Sleep:        Treatment Plan Summary: Daily contact with patient to assess and evaluate symptoms and progress in treatment, Medication  management and Plan Inpatient psychiatric treatment   Medication:  Restarted Zoloft 100 mg daily and Started Zyprexa 5 mg Qd   Disposition: Recommend psychiatric Inpatient admission when medically cleared.  This service was provided via telemedicine using a 2-way, interactive audio and video technology.  Names of all persons participating in this telemedicine service and their role in this encounter. Name: [employer] Role: NP  Name: Dr. Assunta Found Role: Psychiatrist  Name: Jola Babinski Role: Patient   Name:  Role:     [name], NP 07/23/2019 11:46 AM

## 2019-07-23 NOTE — Progress Notes (Signed)
Patient has large tattoo on R lower arm.  Scrapes and scratches on fingers, hands, lower arms, face.

## 2019-07-23 NOTE — ED Notes (Addendum)
Pt alert, denies SI/HI at this time. Pt dressed in only boxers. Follows commands, denies visual hallucinations, repeatedly says "I just hear God's word, if you hear me laughing, it's bc I just hear Him."

## 2019-07-23 NOTE — ED Notes (Signed)
Pt arrived to room 34 without difficulty.  Pt was oriented to unit.  Pt is obviously very manic but he is pleasant.  Pt denies S/I and H/I.

## 2019-07-23 NOTE — Progress Notes (Signed)
   07/23/19 2221  Psych Admission Type (Psych Patients Only)  Admission Status Voluntary  Psychosocial Assessment  Patient Complaints Sadness;Worrying  Eye Contact Brief  Facial Expression Anxious  Affect Anxious  Speech Tangential  Interaction Assertive  Motor Activity Slow  Appearance/Hygiene Unremarkable;In scrubs  Behavior Characteristics Cooperative;Anxious  Thought Process  Coherency Tangential  Content Religiosity  Delusions Religious  Perception UTA  Hallucination None reported or observed  Judgment Impaired  Confusion None  Danger to Self  Current suicidal ideation? Denies  Danger to Others  Danger to Others None reported or observed

## 2019-07-23 NOTE — BH Assessment (Signed)
Tele Assessment Note   Patient Name: Eric Baldwin MRN: [id number] Referring Physician: Dione Booze Location of Patient: [place] Location of Provider: Behavioral Health TTS Department  Eric Baldwin is an 44 y.o. male. Per EDP report:Patient to ED, manic behavior on arrival, difficult to determine reason for visit. He reports he is unable to sleep and has been awake for days. He states he needs a work note "but not til Tuesday". He reports he was recently 'saved' and quotes Bible scripture. He denies pain, fever, SI, HI. On review of the medical record, patient seen a few days ago in this ED for evaluation of psychotic behavior, treated and released after [facility]/psychiatric consultation.    TTS: Pt presents very manic during the assessment and a bit confused with tangential speech. Pt states he is at the ED because, " I need to get some rest until I have to get to work ". Pt repeatedly states that he has to get rest for work, and that he needs to speak with his boss about his next work moves. Pt becomes fixated on talking about work and rest for most of the assessment. Pt denies SI, HI and AVH. Pt denies previous SI attempts.  Pt was poor historian. Pt had difficulty answering the questions in the assessment. The Pt was hyper-religious, states he was just saved earlier today at his church called [employer]. The Pt's speech was tangential and had flight of ideas. Pt denies previous inpatient treatment. Pt states he is receiving depression medication from his PCP, states he is taking sertraline. Pt denies any current drug or alcohol use  But current UDS positive for marijuana. Pt BAL currently pending. Pt did not present to be responding to internal stimuli or delusional content but presented with manic behavior. Pt could not provide any collateral contact at this time, pt states he lost his phone prior to arriving at ED.   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 No pertinent past medical history.  History reviewed. No pertinent surgical history.  Family History:  Family History  Problem Relation Age of Onset  . Heart disease Father     Social History:  reports that he has quit smoking. His smoking use included e-cigarettes. He has never used smokeless tobacco. He reports current alcohol use of about 10.0 standard drinks of alcohol per week. He reports that he does not use drugs.  Additional Social History:  Alcohol / Drug Use Pain Medications: see MAR Prescriptions: see MAR Over the Counter: see MAR  CIWA: CIWA-Ar BP: 137/89 Pulse Rate: 97 COWS:    Allergies:  Allergies  Allergen Reactions  . Mango Flavor Rash    Home Medications: (Not in a hospital admission)   OB/GYN Status:  No LMP for male patient.  General Assessment Data Location of Assessment: [facility] ED TTS Assessment: In system Is this a Tele or Face-to-Face Assessment?: Tele Assessment Is this an Initial Assessment or a Re-assessment for this encounter?: Initial Assessment Patient Accompanied by:: N/A Language Other than English: No Can pt return to current living arrangement?: Yes Admission Status: Voluntary Is patient capable of signing voluntary admission?: Yes Referral Source: Self/Family/Friend Insurance type: BCBS     Crisis Care Plan Legal Guardian: Other:(self) Name of Psychiatrist: none Name of Therapist: none  Education Status Is patient currently in school?: No Is the patient employed, unemployed or receiving disability?: Employed  Risk to self with the past 6 months Suicidal Ideation: No Has patient been a risk to  self within the past 6 months prior to admission? : No Suicidal Intent: No Has patient had any suicidal intent within the past 6 months prior to admission? : No Is patient at risk for suicide?: No Suicidal Plan?: No Has patient had any suicidal plan within the past 6 months prior to admission? : No Access to Means: No What  has been your use of drugs/alcohol within the last 12 months?: none(pt denied) Previous Attempts/Gestures: No How many times?: 0 Other Self Harm Risks: (UTA) Triggers for Past Attempts: Unknown Intentional Self Injurious Behavior: None Family Suicide History: No Recent stressful life event(s): Other (Comment) Persecutory voices/beliefs?: No Depression: (UTA) Depression Symptoms: (Unknown) Substance abuse history and/or treatment for substance abuse?: No Suicide prevention information given to non-admitted patients: Not applicable  Risk to Others within the past 6 months Homicidal Ideation: No Does patient have any lifetime risk of violence toward others beyond the six months prior to admission? : No Thoughts of Harm to Others: No Current Homicidal Intent: No Current Homicidal Plan: No Access to Homicidal Means: No Identified Victim: none History of harm to others?: No Assessment of Violence: None Noted Violent Behavior Description: (none) Does patient have access to weapons?: No Criminal Charges Pending?: No Does patient have a court date: No Is patient on probation?: No  Psychosis Hallucinations: (UTA) Delusions: (UTA)  Mental Status Report Appearance/Hygiene: Unremarkable Eye Contact: Fair Motor Activity: Freedom of movement Speech: Tangential Level of Consciousness: Alert Mood: Other (Comment) Affect: Appropriate to circumstance Anxiety Level: Minimal Thought Processes: Flight of Ideas, Tangential Judgement: Partial Orientation: Person, Place, Time, Situation Obsessive Compulsive Thoughts/Behaviors: None  Cognitive Functioning Concentration: Normal Memory: Recent Intact Is patient IDD: No Insight: Poor Impulse Control: Poor Appetite: Poor Have you had any weight changes? : (UTA) Sleep: Unable to Assess Total Hours of Sleep: (UTA) Vegetative Symptoms: None  ADLScreening [facility] Assessment Services) Patient's cognitive ability adequate to safely complete  daily activities?: Yes Patient able to express need for assistance with ADLs?: Yes Independently performs ADLs?: Yes (appropriate for developmental age)  Prior Inpatient Therapy Prior Inpatient Therapy: Yes Prior Therapy Dates: (2019) Prior Therapy Facilty/Provider(s): [facility]) Reason for Treatment: mania  Prior Outpatient Therapy Prior Outpatient Therapy: (Unknown)  ADL Screening (condition at time of admission) Patient's cognitive ability adequate to safely complete daily activities?: Yes Patient able to express need for assistance with ADLs?: Yes Independently performs ADLs?: Yes (appropriate for developmental age)             Regulatory affairs officer (For Healthcare) Does Patient Have a Medical Advance Directive?: No Would patient like information on creating a medical advance directive?: No - Patient declined          Disposition: Lindon Romp, FNP recommends pt be observed overnight, reassess in the morning. TTS confirm with provider. Disposition Initial Assessment Completed for this Encounter: Yes  This service was provided via telemedicine using a 2-way, interactive audio and video technology.  Names of all persons participating in this telemedicine service and their role in this encounter. Name: Shivaan Tierno Role: Patient  Name: Antony Contras Role: TTS  Name:  Role:   Name: Role:     Donato Heinz 07/23/2019 5:51 AM

## 2019-07-23 NOTE — Progress Notes (Signed)
Patient came to [facility] admission room.  Patient laughing and talking to himself, pointing at tattoo on R lower arm.  Patient stated "add to zero and your questions will be answered".  Patient talking to someone on the wall.  Continues to laugh.  Would not answer questions appropriately.  Vital signs taken and recorded.  Belongings sheet completed.   [address] has clothes, toiletries, etc. Patient escorted to 500 hall.

## 2019-07-23 NOTE — ED Notes (Signed)
Pt changing into burgundy scrubs at this time.

## 2019-07-23 NOTE — ED Notes (Signed)
Pt asleep, will collect vitals when pt wakes up.

## 2019-07-24 DIAGNOSIS — F200 Paranoid schizophrenia: Secondary | ICD-10-CM

## 2019-07-24 MED ORDER — RISPERIDONE 3 MG PO TABS
3.0000 mg | ORAL_TABLET | Freq: Two times a day (BID) | ORAL | Status: DC
  Administered 2019-07-24 – 2019-07-25 (×2): 3 mg via ORAL
  Filled 2019-07-24 (×5): qty 1

## 2019-07-24 MED ORDER — TRAZODONE HCL 100 MG PO TABS
200.0000 mg | ORAL_TABLET | Freq: Every day | ORAL | Status: DC
  Administered 2019-07-24 – 2019-07-25 (×2): 200 mg via ORAL
  Filled 2019-07-24 (×4): qty 2

## 2019-07-24 MED ORDER — BENZTROPINE MESYLATE 1 MG PO TABS
1.0000 mg | ORAL_TABLET | Freq: Two times a day (BID) | ORAL | Status: DC
  Administered 2019-07-24 – 2019-07-26 (×4): 1 mg via ORAL
  Filled 2019-07-24 (×9): qty 1

## 2019-07-24 NOTE — BHH Suicide Risk Assessment (Signed)
[facility] Admission Suicide Risk Assessment   Total Time spent with patient: 45 minutes Principal Problem: Rule out schizophrenia rule out bipolar with psychosis Diagnosis:  Active Problems:   Psychosis (HCC)   Schizophrenia, paranoid (HCC)  Subjective Data: Admitted for mania  Continued Clinical Symptoms:    The "Alcohol Use Disorders Identification Test", Guidelines for Use in Primary Care, Second Edition.  World Science writer [facility]). Score between 0-7:  no or low risk or alcohol related problems. Score between 8-15:  moderate risk of alcohol related problems. Score between 16-19:  high risk of alcohol related problems. Score 20 or above:  warrants further diagnostic evaluation for alcohol dependence and treatment.   CLINICAL FACTORS:   Previous Psychiatric Diagnoses and Treatments Musculoskeletal: Strength & Muscle Tone: within normal limits Gait & Station: normal Patient leans: N/A  Psychiatric Specialty Exam: Physical Exam  Nursing note and vitals reviewed. Constitutional: He appears well-developed and well-nourished.    Review of Systems  Constitutional: Negative.   Respiratory: Negative.   Cardiovascular: Negative.   Endocrine: Negative.   Genitourinary: Negative.   Neurological: Negative.     Blood pressure (!) 109/94, pulse (!) 110, temperature 98.1 F (36.7 C).There is no height or weight on file to calculate BMI.  General Appearance: Casual  Eye Contact:  Minimal  Speech:  Pressured  Volume:  Normal  Mood:  Hypomanic but ability to be calm and participate in interview  Affect:  Restricted  Thought Process:  Irrelevant and Descriptions of Associations: Loose  Orientation:  Full (Time, Place, and Person)  Thought Content:  Paranoid delusions and religious focus  Suicidal Thoughts:  No  Homicidal Thoughts:  No  Memory:  Immediate;   Fair Recent;   Fair Remote;   Fair  Judgement:  Poor  Insight:  Shallow  Psychomotor Activity:  Normal  Concentration:   Concentration: Poor and Attention Span: Poor  Recall:  [employer] of Knowledge:  Fair  Language:  Good  Akathisia:  Negative  Handed:  Right  AIMS (if indicated):     Assets:  Communication Skills Desire for Improvement Physical Health Resilience Social Support Vocational/Educational  ADL's:  Intact  Cognition:  WNL  Sleep:  Number of Hours: 5.5     COGNITIVE FEATURES THAT CONTRIBUTE TO RISK:  Polarized thinking    SUICIDE RISK:   Minimal: No identifiable suicidal ideation.  Patients presenting with no risk factors but with morbid ruminations; may be classified as minimal risk based on the severity of the depressive symptoms  PLAN OF CARE: Admit for stabilization  I certify that inpatient services furnished can reasonably be expected to improve the patient's condition.   Malvin Johns, MD 07/24/2019, 9:37 AM

## 2019-07-24 NOTE — Progress Notes (Signed)
   07/24/19 2045  Psych Admission Type (Psych Patients Only)  Admission Status Voluntary  Psychosocial Assessment  Patient Complaints Worrying;Suspiciousness  Eye Contact Brief  Facial Expression Anxious  Affect Anxious  Child psychotherapist Assertive  Motor Activity Slow  Behavior Characteristics Cooperative;Anxious  Mood Anxious;Pleasant  Aggressive Behavior  Effect No apparent injury  Thought Process  Coherency Tangential  Content WDL  Delusions WDL  Perception WDL  Hallucination None reported or observed  Judgment Impaired  Confusion Mild  Danger to Self  Current suicidal ideation? Denies  Danger to Others  Danger to Others None reported or observed  Pt still paranoid. Takes meds without incident.

## 2019-07-24 NOTE — Progress Notes (Signed)
Recreation Therapy Notes  INPATIENT RECREATION THERAPY ASSESSMENT  Patient Details Name: Alphonso Gregson MRN: [id number] DOB: 07/03/1975 Today's Date: 07/24/2019       Information Obtained From: Patient  Able to Participate in Assessment/Interview: Yes  Patient Presentation: Alert  Reason for Admission (Per Patient): Other (Comments)(Paranoid)  Patient Stressors: Other (Comment)(Trying to get some sleep)  Coping Skills:   Isolation, Write, Sports, Exercise, Music, Talk, Prayer, Read  Leisure Interests (2+):  Exercise - Lifting Weights, Games - Video games, Games - Other (Comment)(Pool)  Frequency of Recreation/Participation: Other (Comment)(Not lately)  Awareness of Community Resources:  Yes  Community Resources:  Park, [employer], Research scientist (physical sciences), Public affairs consultant  Current Use: Yes(If they are close by)  If no, Barriers?:    Expressed Interest in [employer] Information: No  [employer] of Residence:  Engineer, technical sales  Patient Main Form of Transportation: Set designer  Patient Strengths:  Write Therapist, music down; Study words in the dictionary  Patient Identified Areas of Improvement:  Reading, Math, [place]  Patient Goal for Hospitalization:  "get out of here"  Current SI (including self-harm):  No  Current HI:  No  Current AVH: Yes("a man saying get where you need to be")  Staff Intervention Plan: Group Attendance, Collaborate with Interdisciplinary Treatment Team  Consent to Intern Participation: N/A     Caroll Rancher, LRT/CTRS  Caroll Rancher A 07/24/2019, 12:18 PM

## 2019-07-24 NOTE — Progress Notes (Signed)
Recreation Therapy Notes  Date: 2.16.21 Time: 1000 Location: 500 Hall Dayroom  Group Topic: Self-Esteem  Goal Area(s) Addresses:  Patient will successfully identify positive attributes about themselves.  Patient will successfully identify benefit of improved self-esteem.   Intervention: Colored pencils, blank masks  Activity: How I See Me.  Patients were to design a blank mask to highlight some of the positive attributes about themselves.     Education:  Self-Esteem, Building control surveyor.   Education Outcome: Acknowledges education/In group clarification offered/Needs additional education  Clinical Observations/Feedback: Pt did not attend group session.    Caroll Rancher, LRT/CTRS         Lillia Abed, Kwali Wrinkle A 07/24/2019 12:12 PM
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
## 2019-07-24 NOTE — BHH Counselor (Signed)
Patient endorses AH and is responding to internal stimuli, CSW unable to complete PSA at this time and will follow up with patient at a later date.  Enid Cutter, MSW, LCSW-A Clinical Social Worker [facility]  [phone]

## 2019-07-25 ENCOUNTER — Ambulatory Visit: Payer: BC Managed Care – PPO | Admitting: Family Medicine

## 2019-07-25 MED ORDER — RISPERIDONE 3 MG PO TABS
6.0000 mg | ORAL_TABLET | Freq: Every day | ORAL | Status: DC
  Administered 2019-07-25: 6 mg via ORAL
  Filled 2019-07-25 (×3): qty 2

## 2019-07-25 MED ORDER — RISPERIDONE 3 MG PO TABS
3.0000 mg | ORAL_TABLET | Freq: Every day | ORAL | Status: DC
  Administered 2019-07-26: 3 mg via ORAL
  Filled 2019-07-25 (×3): qty 1

## 2019-07-25 NOTE — Progress Notes (Signed)
Adult Psychoeducational Group Note  Date:  07/25/2019 Time:  8:23 PM  Group Topic/Focus:  Wrap-Up Group:   The focus of this group is to help patients review their daily goal of treatment and discuss progress on daily workbooks.  Participation Level:  Active  Participation Quality:  Appropriate  Affect:  Appropriate  Cognitive:  Appropriate  Insight: Appropriate  Engagement in Group:  Engaged  Modes of Intervention:  Discussion  Additional Comments:  Patient attended group and said that his day was a 10.  His goal for today was to get better and be discharged. Patient said he isn't SI or HI.   Idalee Foxworthy W Elvia Aydin 07/25/2019, 8:23 PM

## 2019-07-25 NOTE — Progress Notes (Signed)
Recreation Therapy Notes  Date:  2.17.21 Time: 1000 Location: 500 Hall Dayroom  Group Topic: Stress Management  Goal Area(s) Addresses:  Patient will identify positive stress management techniques. Patient will identify benefits of using stress management post d/c.  Behavioral Response:  Engaged  Intervention: Stress Management  Activity : Meditation.  LRT a meditation that focused on clearing your chakras and doing positive self talk.  Patients were to listen and mentally focus on saying positive chants to self.  Education:  Stress Management, Discharge Planning.   Education Outcome: Acknowledges Education  Clinical Observations/Feedback: Pt was bright and active in activity.  Pt seemed to be fully engaged in the activity.  Pt laid on the floor with his eyes closed appearing attentive to the meditation as it played.  Pt expressed enjoying the activity and feeling more relaxed at completion.   Caroll Rancher, LRT/CTRS         Caroll Rancher A 07/25/2019 11:35 AM

## 2019-07-25 NOTE — Progress Notes (Signed)
[facility] MD Progress Note  07/25/2019 9:38 AM Eric Baldwin  MRN:  [id number] Subjective:  Patient exhibits paranoid idealizations with delusions of patients listening to him and trying to hear what he is saying. Believes "everone is jealous of him" and states he has felt like this for years. Patient SH and SI. He remains focused on discharge so he can return to work at [employer] and plans to receive a new apartment from his Secondary school teacher. Also makes claims of secret plans with his cousin in the acting business. Principal Problem: <principal problem not specified> Diagnosis: Active Problems:   Psychosis (HCC)   Schizophrenia, paranoid (Magazine)  Total Time spent with patient: 30 minutes  Past Psychiatric History: Prior diagnoses included depression with psychosis. Patient is either bipolar manic with psychosis or schizoaffective/schizophrenic type patient. Likely the latter given the time frame, nature of his delusions, and possible hallucinations.  Further cannabis dependency may be to blame for this recurrence  Past Medical History: History reviewed. No pertinent past medical history. History reviewed. No pertinent surgical history. Family History:  Family History  Problem Relation Age of Onset  . Heart disease Father    Family Psychiatric  History: No new data shared Social History:  Social History   Substance and Sexual Activity  Alcohol Use Yes  . Alcohol/week: 10.0 standard drinks  . Types: 5 Cans of beer, 5 Shots of liquor per week     Social History   Substance and Sexual Activity  Drug Use No    Social History   Socioeconomic History  . Marital status: Single    Spouse name: Not on file  . Number of children: Not on file  . Years of education: Not on file  . Highest education level: Not on file  Occupational History  . Not on file  Tobacco Use  . Smoking status: Former Smoker    Types: E-cigarettes  . Smokeless tobacco: Never Used  Substance and Sexual Activity  .  Alcohol use: Yes    Alcohol/week: 10.0 standard drinks    Types: 5 Cans of beer, 5 Shots of liquor per week  . Drug use: No  . Sexual activity: Not on file  Other Topics Concern  . Not on file  Social History Narrative   ** Merged History Encounter **       Social Determinants of Health   Financial Resource Strain:   . Difficulty of Paying Living Expenses: Not on file  Food Insecurity:   . Worried About Charity fundraiser in the Last Year: Not on file  . Ran Out of Food in the Last Year: Not on file  Transportation Needs:   . Lack of Transportation (Medical): Not on file  . Lack of Transportation (Non-Medical): Not on file  Physical Activity:   . Days of Exercise per Week: Not on file  . Minutes of Exercise per Session: Not on file  Stress:   . Feeling of Stress : Not on file  Social Connections:   . Frequency of Communication with Friends and Family: Not on file  . Frequency of Social Gatherings with Friends and Family: Not on file  . Attends Religious Services: Not on file  . Active Member of Clubs or Organizations: Not on file  . Attends Archivist Meetings: Not on file  . Marital Status: Not on file   Additional Social History:  Sleep: Good  Appetite:  NA  Current Medications: Current Facility-Administered Medications  Medication Dose Route Frequency Provider Last Rate Last Admin  . acetaminophen (TYLENOL) tablet 650 mg  650 mg Oral Q6H PRN Antonieta Pert, MD      . alum & mag hydroxide-simeth (MAALOX/MYLANTA) 200-200-20 MG/5ML suspension 30 mL  30 mL Oral Q4H PRN Antonieta Pert, MD      . benztropine (COGENTIN) tablet 1 mg  1 mg Oral BID Malvin Johns, MD   1 mg at 07/25/19 0830  . hydrOXYzine (ATARAX/VISTARIL) tablet 25 mg  25 mg Oral TID PRN Antonieta Pert, MD   25 mg at 07/23/19 1604  . LORazepam (ATIVAN) tablet 1 mg  1 mg Oral Q6H PRN Antonieta Pert, MD      . magnesium hydroxide (MILK OF MAGNESIA)  suspension 30 mL  30 mL Oral Daily PRN Antonieta Pert, MD      . OLANZapine zydis (ZYPREXA) disintegrating tablet 5 mg  5 mg Oral Q8H PRN Antonieta Pert, MD       And  . ziprasidone (GEODON) injection 20 mg  20 mg Intramuscular PRN Antonieta Pert, MD      . risperiDONE (RISPERDAL) tablet 3 mg  3 mg Oral BID Malvin Johns, MD   3 mg at 07/25/19 0830  . traZODone (DESYREL) tablet 200 mg  200 mg Oral QHS Malvin Johns, MD   200 mg at 07/24/19 2015    Lab Results: No results found for this or any previous visit (from the past 48 hour(s)).  Blood Alcohol level:  Lab Results  Component Value Date   ETH <10 07/23/2019   ETH <10 07/20/2019    Metabolic Disorder Labs: No results found for: HGBA1C, MPG No results found for: PROLACTIN No results found for: CHOL, TRIG, HDL, CHOLHDL, VLDL, LDLCALC  Physical Findings: AIMS:  , ,  ,  ,    CIWA:    COWS:     Musculoskeletal: Strength & Muscle Tone: within normal limits Gait & Station: normal Patient leans: N/A  Psychiatric Specialty Exam: Physical Exam  Review of Systems  Blood pressure (!) 122/99, pulse (!) 109, temperature 98.6 F (37 C), resp. rate 18.There is no height or weight on file to calculate BMI.  General Appearance: Disheveled and notable bruising and well healed wounds on hands  Eye Contact:  Good  Speech:  Normal Rate  Volume:  Normal  Mood:  Anxious  Affect:  Restricted  Thought Process:  Disorganized  Orientation:  Full (Time, Place, and Person)  Thought Content:  Delusions, Hallucinations: None and Paranoid Ideation  Suicidal Thoughts:  No  Homicidal Thoughts:  No  Memory:  Immediate;   Good Recent;   Good Remote;   Good  Judgement:  Poor  Insight:  Lacking  Psychomotor Activity:  NA  Concentration:  Concentration: Fair and Attention Span: Poor  Recall:  Fair  Fund of Knowledge:  Good  Language:  Good  Akathisia:  No  Handed:  Right  AIMS (if indicated):     Assets:  Financial  Resources/Insurance Physical Health  ADL's:  Intact  Cognition:  Impaired,  Moderate  Sleep:  Number of Hours: 6.75     Treatment Plan Summary: Daily contact with patient to assess and evaluate symptoms and progress in treatment, Medication management and Plan Escalate Risperdal to 9 mg a day total continue reality based therapy no change in precautions  Safety: Access to firearm  Oswaldo Done A  Anibal Henderson, Medical Student 07/25/2019, 9:38 AM

## 2019-07-25 NOTE — Tx Team (Addendum)
Interdisciplinary Treatment and Diagnostic Plan Update  07/25/2019 Time of Session: 10:10am  Eric Baldwin MRN: [id number]  Principal Diagnosis: <principal problem not specified>  Secondary Diagnoses: Active Problems:   Psychosis ([place])   Schizophrenia, paranoid ([place])   Current Medications:  Current Facility-Administered Medications  Medication Dose Route Frequency Provider Last Rate Last Admin  . acetaminophen (TYLENOL) tablet 650 mg  650 mg Oral Q6H PRN Sharma Covert, MD      . alum & mag hydroxide-simeth (MAALOX/MYLANTA) 200-200-20 MG/5ML suspension 30 mL  30 mL Oral Q4H PRN Sharma Covert, MD      . benztropine (COGENTIN) tablet 1 mg  1 mg Oral BID Johnn Hai, MD   1 mg at 07/25/19 0830  . hydrOXYzine (ATARAX/VISTARIL) tablet 25 mg  25 mg Oral TID PRN Sharma Covert, MD   25 mg at 07/23/19 1604  . LORazepam (ATIVAN) tablet 1 mg  1 mg Oral Q6H PRN Sharma Covert, MD      . magnesium hydroxide (MILK OF MAGNESIA) suspension 30 mL  30 mL Oral Daily PRN Sharma Covert, MD      . OLANZapine zydis (ZYPREXA) disintegrating tablet 5 mg  5 mg Oral Q8H PRN Sharma Covert, MD       And  . ziprasidone (GEODON) injection 20 mg  20 mg Intramuscular PRN Sharma Covert, MD      . Derrill Memo ON 07/26/2019] risperiDONE (RISPERDAL) tablet 3 mg  3 mg Oral Daily Johnn Hai, MD      . risperiDONE (RISPERDAL) tablet 6 mg  6 mg Oral QHS Johnn Hai, MD      . traZODone (DESYREL) tablet 200 mg  200 mg Oral QHS Johnn Hai, MD   200 mg at 07/24/19 2015   PTA Medications: Medications Prior to Admission  Medication Sig Dispense Refill Last Dose  . sertraline (ZOLOFT) 100 MG tablet TAKE 1 TABLET BY MOUTH DAILY (Patient taking differently: Take 100 mg by mouth daily. ) 90 tablet 1   . sertraline (ZOLOFT) 50 MG tablet TAKE 1 TABLET BY MOUTH DAILY(WITH 100 MG TABLET for 134m QD) (Patient not taking: Reported on 07/23/2019) 90 tablet 1     Patient Stressors:    Patient  Strengths:    Treatment Modalities: Medication Management, Group therapy, Case management,  1 to 1 session with clinician, Psychoeducation, Recreational therapy.   Physician Treatment Plan for Primary Diagnosis: <principal problem not specified> Long Term Goal(s): Improvement in symptoms so as ready for discharge Improvement in symptoms so as ready for discharge   Short Term Goals: Ability to identify and develop effective coping behaviors will improve Ability to maintain clinical measurements within normal limits will improve Compliance with prescribed medications will improve Ability to identify triggers associated with substance abuse/mental health issues will improve Ability to identify changes in lifestyle to reduce recurrence of condition will improve Ability to disclose and discuss suicidal ideas Ability to identify and develop effective coping behaviors will improve  Medication Management: Evaluate patient's response, side effects, and tolerance of medication regimen.  Therapeutic Interventions: 1 to 1 sessions, Unit Group sessions and Medication administration.  Evaluation of Outcomes: Not Met  Physician Treatment Plan for Secondary Diagnosis: Active Problems:   Psychosis ([place]   Schizophrenia, paranoid ([place]  Long Term Goal(s): Improvement in symptoms so as ready for discharge Improvement in symptoms so as ready for discharge   Short Term Goals: Ability to identify and develop effective coping behaviors will improve Ability to maintain clinical measurements  within normal limits will improve Compliance with prescribed medications will improve Ability to identify triggers associated with substance abuse/mental health issues will improve Ability to identify changes in lifestyle to reduce recurrence of condition will improve Ability to disclose and discuss suicidal ideas Ability to identify and develop effective coping behaviors will improve     Medication Management:  Evaluate patient's response, side effects, and tolerance of medication regimen.  Therapeutic Interventions: 1 to 1 sessions, Unit Group sessions and Medication administration.  Evaluation of Outcomes: Not Met   RN Treatment Plan for Primary Diagnosis: <principal problem not specified> Long Term Goal(s): Knowledge of disease and therapeutic regimen to maintain health will improve  Short Term Goals: Ability to participate in decision making will improve, Ability to verbalize feelings will improve, Ability to disclose and discuss suicidal ideas, Ability to identify and develop effective coping behaviors will improve and Compliance with prescribed medications will improve  Medication Management: RN will administer medications as ordered by provider, will assess and evaluate patient's response and provide education to patient for prescribed medication. RN will report any adverse and/or side effects to prescribing provider.  Therapeutic Interventions: 1 on 1 counseling sessions, Psychoeducation, Medication administration, Evaluate responses to treatment, Monitor vital signs and CBGs as ordered, Perform/monitor CIWA, COWS, AIMS and Fall Risk screenings as ordered, Perform wound care treatments as ordered.  Evaluation of Outcomes: Not Met   LCSW Treatment Plan for Primary Diagnosis: <principal problem not specified> Long Term Goal(s): Safe transition to appropriate next level of care at discharge, Engage patient in therapeutic group addressing interpersonal concerns.  Short Term Goals: Engage patient in aftercare planning with referrals and resources and Increase skills for wellness and recovery  Therapeutic Interventions: Assess for all discharge needs, 1 to 1 time with Social worker, Explore available resources and support systems, Assess for adequacy in community support network, Educate family and significant other(s) on suicide prevention, Complete Psychosocial Assessment, Interpersonal group  therapy.  Evaluation of Outcomes: Not Met   Progress in Treatment: Attending groups: Yes. Participating in groups: Yes. Taking medication as prescribed: Yes. Toleration medication: Yes. Family/Significant other contact made: No, will contact:  if given consent  Patient understands diagnosis: No. Discussing patient identified problems/goals with staff: Yes. Medical problems stabilized or resolved: Yes. Denies suicidal/homicidal ideation: Yes. Issues/concerns per patient self-inventory: No. Other:   New problem(s) identified: No, Describe:  none  New Short Term/Long Term Goal(s): Medication stabilization, elimination of SI thoughts, and development of a comprehensive mental wellness plan.   Patient Goals:  Pt was in group meditating   Discharge Plan or Barriers: CSW will continue to follow up for appropriate referrals and possible discharge planning  Reason for Continuation of Hospitalization: Aggression Delusions  Hallucinations [place] Medication stabilization  Estimated Length of Stay: 3-5 days   Attendees: Patient:  07/25/2019   Physician: Dr. Jake Samples, MD 07/25/2019   Nursing:  07/25/2019   RN Care Manager: 07/25/2019   Social Worker: Ardelle Anton, LCSW 07/25/2019   Recreational Therapist:  07/25/2019   Other: Ovidio Kin, MSW intern  07/25/2019   Other:  07/25/2019   Other: 07/25/2019     Scribe for Treatment Team: Billey Chang, Student-Social Work 07/25/2019 10:26 AM

## 2019-07-25 NOTE — Progress Notes (Signed)
Patient has been having conversations that could not be tracked and did not make any sense.  Patient has been Facilities manager of thing and trying to explain what they meant.   Assess patient for safety, offer medications as prescribed.   Patient able to contract for safety, Continue to monitor as planned.

## 2019-07-25 NOTE — Progress Notes (Signed)
[facility] LCSW Group Therapy Note  Date/Time: 2/17 @ 1:30pm   Type of Therapy/Topic:  Group Therapy:  Feelings about Diagnosis  Participation Level:  Did Not Attend   Mood: Pt did not attend group    Description of Group:    This group will allow patients to explore their thoughts and feelings about diagnoses they have received. Patients will be guided to explore their level of understanding and acceptance of these diagnoses. Facilitator will encourage patients to process their thoughts and feelings about the reactions of others to their diagnosis, and will guide patients in identifying ways to discuss their diagnosis with significant others in their lives. This group will be process-oriented, with patients participating in exploration of their own experiences as well as giving and receiving support and challenge from other group members.   Therapeutic Goals: 1. Patient will demonstrate understanding of diagnosis as evidence by identifying two or more symptoms of the disorder:  2. Patient will be able to express two feelings regarding the diagnosis 3. Patient will demonstrate ability to communicate their needs through discussion and/or role plays  Summary of Patient Progress:  Pt did not attend   Therapeutic Modalities:   Cognitive Behavioral Therapy Brief Therapy Feelings Identification   Earlyne Iba, MSW intern

## 2019-07-26 MED ORDER — HYDROXYZINE HCL 25 MG PO TABS: 25.0000 mg | ORAL_TABLET | Freq: Three times a day (TID) | ORAL | 0 refills | Status: AC | PRN

## 2019-07-26 MED ORDER — RISPERIDONE 3 MG PO TABS: ORAL_TABLET | ORAL | 0 refills | Status: AC

## 2019-07-26 MED ORDER — TRAZODONE HCL 100 MG PO TABS: 200.0000 mg | ORAL_TABLET | Freq: Every day | ORAL | 0 refills | Status: AC

## 2019-07-26 MED ORDER — SERTRALINE HCL 100 MG PO TABS: ORAL_TABLET | ORAL | 0 refills | Status: DC

## 2019-07-26 MED ORDER — BENZTROPINE MESYLATE 1 MG PO TABS: 1.0000 mg | ORAL_TABLET | Freq: Two times a day (BID) | ORAL | 0 refills | Status: AC

## 2019-07-26 NOTE — Progress Notes (Signed)
  [facility] Adult Case Management Discharge Plan :  Will you be returning to the same living situation after discharge:  Yes,  own apartment At discharge, do you have transportation home?: No. Cab voucher provided.   Do you have the ability to pay for your medications: Yes,  BCBS  Release of information consent forms completed and in the chart;  Patient's signature needed at discharge.  Patient to Follow up at: Follow-up Information    [facility] Follow up.   Why: Your Child psychotherapist will contact you with an appointment for follow up.  The office is closed today due to the ice.  Contact information: [address] [place] [address] [phone]       [facility] Follow up.   Why: Your Child psychotherapist will call you with an appointment.  The office is closed due to the ice. Contact information: [address] [place], [place] [address] P: [phone] F: [phone]          Next level of care provider has access to [facility] Link:no  Safety Planning and Suicide Prevention discussed: No. Pt declined consent.       Has patient been referred to the Quitline?: Patient refused referral  Patient has been referred for addiction treatment: N/A  [name], LCSW 07/26/2019, 11:00 AM
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
## 2019-07-26 NOTE — [facility] Suicide Risk Assessment (Signed)
[facility] INPATIENT:  Family/Significant Other Suicide Prevention Education  Suicide Prevention Education:  Patient Refusal for Family/Significant Other Suicide Prevention Education: The patient Eric Baldwin has refused to provide written consent for family/significant other to be provided Family/Significant Other Suicide Prevention Education during admission and/or prior to discharge.  Physician notified.  [name], LCSW 07/26/2019, 10:22 AM

## 2019-07-26 NOTE — Progress Notes (Signed)
Pt discharged to lobby. Pt was stable and appreciative at that time. All papers and prescriptions were given and valuables returned. Verbal understanding expressed. Denies SI/HI and A/VH. Pt given opportunity to express concerns and ask questions.  

## 2019-07-26 NOTE — Plan of Care (Signed)
Pt was able to participate in groups without prompting from LRT at completion of recreation therapy group sessions.   Caroll Rancher, LRT/CTRS

## 2019-07-26 NOTE — Progress Notes (Signed)
Recreation Therapy Notes  Date: 2.18.21 Time: 1000 Location: 500 Hall Dayroom  Group Topic: Communication  Goal Area(s) Addresses:  Patient will effectively communicate with peers in group.  Patient will verbalize benefit of healthy communication. Patient will verbalize positive effect of healthy communication on post d/c goals.  Patient will identify communication techniques that made activity effective for group.   Behavioral Response: Engaged  Intervention: Pencils, paper  Activity: Geometrical Drawings.  Patients took turns describing pictures to the group.  The person describing the picture could use as much detail as necessary to describe the picture.  The remaining group members had to draw the as it was described to them.  The group could not ask any clarifying questions.  The group could only ask the presenter to repeat themselves.  Education: Communication, Discharge Planning  Education Outcome: Acknowledges understanding/In group clarification offered/Needs additional education.   Clinical Observations/Feedback: Pt was active and engaged in group.  Pt expressed it being hard to give the exact location of the shapes because "when I'm looking at the paper to describe it, I forgot it's like a mirror image of how to draw it".  Pt was able to focus but had to be reminded he couldn't ask specific questions.  Pt also talked about how he naturally talks loud and some people may take that the wrong way.      Caroll Rancher, LRT/CTRS     Caroll Rancher A 07/26/2019 10:52 AM

## 2019-07-26 NOTE — Progress Notes (Signed)
PRovided support with pt while rounding on unit.  He was tangential in conversation and fixated on names.  Chaplain provided space for pt to connect with elements of his life that are meaningful to him.  

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
## 2019-07-26 NOTE — Progress Notes (Signed)
Recreation Therapy Notes  INPATIENT RECREATION TR PLAN  Patient Details Name: Eric Baldwin MRN: [id number] DOB: 16-Oct-1975 Today's Date: 07/26/2019  Rec Therapy Plan Is patient appropriate for Therapeutic Recreation?: Yes Treatment times per week: about 3 days Estimated Length of Stay: 5-7 days TR Treatment/Interventions: Group participation (Comment)  Discharge Criteria Pt will be discharged from therapy if:: Discharged Treatment plan/goals/alternatives discussed and agreed upon by:: Patient/family  Discharge Summary Short term goals set: See patient care plan Short term goals met: Complete Progress toward goals comments: Groups attended Which groups?: Stress management, Communication Reason goals not met: None Therapeutic equipment acquired: N/A Reason patient discharged from therapy: Discharge from hospital Pt/family agrees with progress & goals achieved: Yes Date patient discharged from therapy: 07/26/19    Victorino Sparrow, LRT/CTRS  Ria Comment, Brailee Riede A 07/26/2019, 11:16 AM

## 2019-07-26 NOTE — BHH Suicide Risk Assessment (Signed)
[facility] Discharge Suicide Risk Assessment   Principal Problem: Schizophrenia, paranoid (HCC) Discharge Diagnoses: Principal Problem:   Schizophrenia, paranoid (HCC) Active Problems:   Psychosis (HCC)   Total Time spent with patient: 45 minutes  Musculoskeletal: Strength & Muscle Tone: within normal limits Gait & Station: normal Patient leans: N/A  Psychiatric Specialty Exam: Physical Exam  Review of Systems  Blood pressure 130/81, pulse (!) 108, temperature 98.7 F (37.1 C), resp. rate 18, SpO2 100 %.There is no height or weight on file to calculate BMI.  General Appearance: Casual  Eye Contact:  Fair  Speech:  Clear and Coherent  Volume:  Increased  Mood:  Anxious  Affect:  Congruent  Thought Process:  Coherent and Goal Directed  Orientation:  Full (Time, Place, and Person)  Thought Content:  Denies previously expressed delusional material  Suicidal Thoughts:  No  Homicidal Thoughts:  No  Memory:  Immediate;   Fair Recent;   Fair Remote;   Fair  Judgement:  Fair  Insight:  Fair  Psychomotor Activity:  Normal  Concentration:  Concentration: Fair and Attention Span: Poor  Recall:  [employer] of Knowledge:  Fair  Language:  Fair  Akathisia:  Negative  Handed:  Right  AIMS (if indicated):     Assets:  Leisure Time Physical Health  ADL's:  Intact  Cognition:  WNL  Sleep:  Number of Hours: 6.75  Mental Status Per Nursing Assessment::   On Admission:     Demographic Factors:  Living alone  Loss Factors: Decrease in vocational status  Historical Factors: Impulsivity  Risk Reduction Factors:   Sense of responsibility to family and Religious beliefs about death  Continued Clinical Symptoms:  Schizophrenia:   Less than 42 years old Paranoid or undifferentiated type  Cognitive Features That Contribute To Risk:  Loss of executive function    Suicide Risk:  Minimal: No identifiable suicidal ideation.  Patients presenting with no risk factors but with morbid  ruminations; may be classified as minimal risk based on the severity of the depressive symptoms    Plan Of Care/Follow-up recommendations:  Activity:  full  Eino Whitner, MD 07/26/2019, 9:34 AM

## 2019-07-26 NOTE — Progress Notes (Signed)
Supervisor met with pt to discuss discharge.  Pt said he goes to [facility] and wants to continue.  Discussed that they may not be able to prescribe his psych meds, but we can try.  Pt agreeable to also sign ROI for [facility] as backup plan for psych meds.  Pt states that he has no issues with housing, despite reports he "tore up" his apartment. Pt plans to return to the office and thinks he will get back in.  Supervisor asked what he will do if that does not work and pt said he "is fine" and "has been planning to go visit his mom in [place]."  Pt not interested in discussing this further and continues to say this is not a problem.  Pt stated his car is at [facility], but pt notes say he was brought in by EMS.  Discussed this as well and pt finally agrees to a taxi ride as other transportation options are closed today.  Pt provides correct address that matches what is on his face sheet.  Cab called and will pick pt up at 1130.  RN informed. Winferd Humphrey, MSW, LCSW Advanced Care Supervisor 07/26/2019 10:59 AM

## 2019-07-26 NOTE — Progress Notes (Signed)
   07/25/19 2236  Psych Admission Type (Psych Patients Only)  Admission Status Voluntary  Psychosocial Assessment  Patient Complaints Anxiety  Eye Contact Brief  Facial Expression Anxious  Affect Anxious  Speech Tangential  Interaction Assertive  Motor Activity Slow  Appearance/Hygiene Improved  Behavior Characteristics Cooperative  Mood Pleasant  Aggressive Behavior  Effect No apparent injury  Thought Process  Coherency Tangential  Content WDL  Delusions WDL  Perception WDL  Hallucination None reported or observed  Judgment Impaired  Confusion Mild  Danger to Self  Current suicidal ideation? Denies  Danger to Others  Danger to Others None reported or observed

## 2019-07-27 NOTE — Progress Notes (Signed)
Scheduled appointment for medication management for patient on 07/30/19 at 4:00 pm, in person at Primary Care at [facility].  Called the patient's phone number on file but it is a non-working number.  Patient was informed prior to discharge to call [place] on his own as well, for appointment.

## 2019-07-30 ENCOUNTER — Other Ambulatory Visit: Payer: Self-pay | Admitting: Family Medicine

## 2019-07-30 ENCOUNTER — Inpatient Hospital Stay: Payer: Self-pay | Admitting: Family Medicine

## 2019-07-30 DIAGNOSIS — F418 Other specified anxiety disorders: Secondary | ICD-10-CM

## 2019-07-31 ENCOUNTER — Other Ambulatory Visit: Payer: Self-pay

## 2019-07-31 ENCOUNTER — Emergency Department (HOSPITAL_COMMUNITY): Payer: BC Managed Care – PPO

## 2019-07-31 ENCOUNTER — Encounter: Payer: Self-pay | Admitting: Family Medicine

## 2019-07-31 ENCOUNTER — Emergency Department (HOSPITAL_COMMUNITY)
Admission: EM | Admit: 2019-07-31 | Discharge: 2019-08-01 | Disposition: A | Discharge status: Other Acute Inpatient Hospital | Payer: BC Managed Care – PPO | Attending: Emergency Medicine | Admitting: Emergency Medicine

## 2019-07-31 DIAGNOSIS — F250 Schizoaffective disorder, bipolar type: Secondary | ICD-10-CM | POA: Insufficient documentation

## 2019-07-31 DIAGNOSIS — Z23 Encounter for immunization: Secondary | ICD-10-CM | POA: Diagnosis not present

## 2019-07-31 DIAGNOSIS — F23 Brief psychotic disorder: Secondary | ICD-10-CM

## 2019-07-31 DIAGNOSIS — Z87891 Personal history of nicotine dependence: Secondary | ICD-10-CM | POA: Insufficient documentation

## 2019-07-31 DIAGNOSIS — Z20822 Contact with and (suspected) exposure to covid-19: Secondary | ICD-10-CM | POA: Insufficient documentation

## 2019-07-31 DIAGNOSIS — Z79899 Other long term (current) drug therapy: Secondary | ICD-10-CM | POA: Insufficient documentation

## 2019-07-31 LAB — CBC
HCT: 44.9 % (ref 39.0–52.0)
Hemoglobin: 14.4 g/dL (ref 13.0–17.0)
MCH: 29.2 pg (ref 26.0–34.0)
MCHC: 32.1 g/dL (ref 30.0–36.0)
MCV: 91.1 fL (ref 80.0–100.0)
Platelets: 341 10*3/uL (ref 150–400)
RBC: 4.93 MIL/uL (ref 4.22–5.81)
RDW: 13.2 % (ref 11.5–15.5)
WBC: 8 10*3/uL (ref 4.0–10.5)
nRBC: 0 % (ref 0.0–0.2)

## 2019-07-31 LAB — COMPREHENSIVE METABOLIC PANEL
ALT: 23 U/L (ref 0–44)
AST: 29 U/L (ref 15–41)
Albumin: 4.3 g/dL (ref 3.5–5.0)
Alkaline Phosphatase: 46 U/L (ref 38–126)
Anion gap: 12 (ref 5–15)
BUN: 16 mg/dL (ref 6–20)
CO2: 24 mmol/L (ref 22–32)
Calcium: 9.4 mg/dL (ref 8.9–10.3)
Chloride: 103 mmol/L (ref 98–111)
Creatinine, Ser: 1.15 mg/dL (ref 0.61–1.24)
GFR calc Af Amer: >60 mL/min (ref >60)
GFR calc non Af Amer: >60 mL/min (ref >60)
Glucose, Bld: 99 mg/dL (ref 70–99)
Potassium: 4.6 mmol/L (ref 3.5–5.1)
Sodium: 139 mmol/L (ref 135–145)
Total Bilirubin: 0.9 mg/dL (ref 0.3–1.2)
Total Protein: 7.9 g/dL (ref 6.5–8.1)

## 2019-07-31 LAB — RAPID URINE DRUG SCREEN, HOSP PERFORMED
Amphetamines: NOT DETECTED
Barbiturates: NOT DETECTED
Benzodiazepines: NOT DETECTED
Cocaine: NOT DETECTED
Opiates: NOT DETECTED
Tetrahydrocannabinol: NOT DETECTED

## 2019-07-31 LAB — SALICYLATE LEVEL: Salicylate Lvl: <7 mg/dL — ABNORMAL LOW (ref 7.0–30.0)

## 2019-07-31 LAB — RESPIRATORY PANEL BY RT PCR (FLU A&B, COVID)
Influenza A by PCR: NEGATIVE
Influenza B by PCR: NEGATIVE
SARS Coronavirus 2 by RT PCR: NEGATIVE

## 2019-07-31 LAB — ETHANOL: Alcohol, Ethyl (B): <10 mg/dL (ref <10)

## 2019-07-31 LAB — ACETAMINOPHEN LEVEL: Acetaminophen (Tylenol), Serum: <10 ug/mL — ABNORMAL LOW (ref 10–30)

## 2019-07-31 MED ORDER — TETANUS-DIPHTH-ACELL PERTUSSIS 5-2.5-18.5 LF-MCG/0.5 IM SUSP
0.5000 mL | Freq: Once | INTRAMUSCULAR | Status: AC
  Administered 2019-07-31: 0.5 mL via INTRAMUSCULAR
  Filled 2019-07-31: qty 0.5

## 2019-07-31 MED ORDER — STERILE WATER FOR INJECTION IJ SOLN
INTRAMUSCULAR | Status: AC
  Administered 2019-07-31: 1.2 mL
  Filled 2019-07-31: qty 10

## 2019-07-31 MED ORDER — ZIPRASIDONE MESYLATE 20 MG IM SOLR
20.0000 mg | Freq: Once | INTRAMUSCULAR | Status: AC
  Administered 2019-07-31: 20 mg via INTRAMUSCULAR
  Filled 2019-07-31: qty 20

## 2019-07-31 MED ORDER — ZIPRASIDONE MESYLATE 20 MG IM SOLR
20.0000 mg | Freq: Once | INTRAMUSCULAR | Status: AC
  Administered 2019-07-31: 20 mg via INTRAMUSCULAR
  Filled 2019-07-31: qty 10

## 2019-07-31 MED ORDER — ACETAMINOPHEN 500 MG PO TABS
1000.0000 mg | ORAL_TABLET | Freq: Four times a day (QID) | ORAL | Status: DC | PRN
  Administered 2019-07-31 – 2019-08-01 (×3): 1000 mg via ORAL
  Filled 2019-07-31 (×4): qty 2

## 2019-07-31 NOTE — ED Notes (Signed)
Breakfast ordered 

## 2019-07-31 NOTE — ED Notes (Signed)
Sitter present at bedside at this time 

## 2019-07-31 NOTE — Progress Notes (Signed)
Pt accepted to [facility]; Adult [address] [facility].      Dr. Estill Cotta is the accepting and attending physician.    Call report to [phone].   Heidi @ [facility]  ED notified.     Pt is IVC.    Pt may be transported by local law enforcement.   Patient may arrive on Wednesday, August 01, 2019 after 9:00am.   Drucilla Schmidt, MSW, LCSW-A Clinical Disposition Social Worker [facility]/TTS [phone]

## 2019-07-31 NOTE — ED Notes (Signed)
Dinner tray arrived 

## 2019-07-31 NOTE — BHH Counselor (Addendum)
TTS attempted to assess. Elon Jester, RN attempted to wake patient up to participate in tele-psych, however patient received IM Geodon and could not rouse for assessment.  TTS will call back at a later time to assess.

## 2019-07-31 NOTE — Progress Notes (Signed)
Patient meets inpatient criteria per Assunta Found, NP. Patient has been faxed out to the following facilities for review:   [facility]  [facility]  CSW will continue to follow and assist with finding bed placement.   Drucilla Schmidt, MSW, LCSW-A Clinical Disposition Social Worker [facility]/TTS [phone]

## 2019-07-31 NOTE — BH Assessment (Signed)
Tele Assessment Note   Patient Name: Eric Baldwin MRN: [id number] Referring Physician: Judd Lien Location of Patient: [facility] ED Location of Provider: Behavioral Health TTS Department  Dailon Sheeran is an 44 y.o. male presenting voluntarily to [facility] ED via EMS. Patient has since been placed under IVC. Patient is a poor historian due to AMS. Chart review utilized with patient interview to complete assessment. Patient appears manic- his speech is rapid, thoughts are tangential, he is restless and paranoid. Patient unable to tell me how he got to ED but states "it's my neighbor that is in love with me" when asked again he states "I began to run because I saw the devil. Patient is preoccupied with moving back to [place] and going back to his job at [employer]. Patient denies SI/HI/AVH. He reports occasional THC use- however UDS has not returned yet. Patient states he does not have anyone to contact for collateral information. [facility] on 2/18 after being hospitalized for psychosis and mania.   Patient is alert and oriented to self only. He is dressed in purple scrubs, sitting upright in bed. His speech is rapid, eye contact is fair, and thoughts are disorganized. His mood is anxious/irritable and his affect is congruent. He has poor insight, judgement, and impulse control. He does not appear to be responding to internal stimuli at times of assessment. Patient appears to be delusional.  Diagnosis: F25.0 Schizoaffective disorder, bipolar type  Past Medical History: No past medical history on file.  No past surgical history on file.  Family History:  Family History  Problem Relation Age of Onset  . Heart disease Father     Social History:  reports that he has quit smoking. His smoking use included e-cigarettes. He has never used smokeless tobacco. He reports current alcohol use of about 10.0 standard drinks of alcohol per week. He reports that he does not use drugs.  Additional  Social History:  Alcohol / Drug Use Pain Medications: see MAR Prescriptions: see MAR Over the Counter: see MAR History of alcohol / drug use?: Yes  CIWA: CIWA-Ar BP: 131/77 Pulse Rate: (!) 102 COWS:    Allergies:  Allergies  Allergen Reactions  . Mango Flavor Rash    Home Medications: (Not in a hospital admission)   OB/GYN Status:  No LMP for male patient.  General Assessment Data Assessment unable to be completed: Yes Reason for not completing assessment: patient sedated Location of Assessment: [facility] ED TTS Assessment: In system Is this a Tele or Face-to-Face Assessment?: Tele Assessment Is this an Initial Assessment or a Re-assessment for this encounter?: Initial Assessment Patient Accompanied by:: N/A Language Other than English: No Living Arrangements: Other (Comment) What gender do you identify as?: Male Marital status: Single Pregnancy Status: No Living Arrangements: Alone Can pt return to current living arrangement?: Yes Admission Status: Involuntary Petitioner: ED Attending Is patient capable of signing voluntary admission?: No Referral Source: Self/Family/Friend Insurance type: BCBS     Crisis Care Plan Living Arrangements: Alone Legal Guardian: (self) Name of Psychiatrist: none Name of Therapist: none  Education Status Is patient currently in school?: No Is the patient employed, unemployed or receiving disability?: Employed  Risk to self with the past 6 months Suicidal Ideation: No Has patient been a risk to self within the past 6 months prior to admission? : No Suicidal Intent: No Has patient had any suicidal intent within the past 6 months prior to admission? : No Is patient at risk for suicide?: No Suicidal  Plan?: No Has patient had any suicidal plan within the past 6 months prior to admission? : No Specify Current Suicidal Plan: none Access to Means: No Specify Access to Suicidal Means: none What has been your use of drugs/alcohol within  the last 12 months?: THC use Previous Attempts/Gestures: No How many times?: 0 Other Self Harm Risks: none Triggers for Past Attempts: Unknown Intentional Self Injurious Behavior: None Family Suicide History: No Recent stressful life event(s): (UTA) Persecutory voices/beliefs?: No Depression: Yes Depression Symptoms: Feeling worthless/self pity, Isolating, Insomnia Substance abuse history and/or treatment for substance abuse?: No Suicide prevention information given to non-admitted patients: Not applicable  Risk to Others within the past 6 months Homicidal Ideation: No Does patient have any lifetime risk of violence toward others beyond the six months prior to admission? : No Thoughts of Harm to Others: No Current Homicidal Intent: No Current Homicidal Plan: No Access to Homicidal Means: No Identified Victim: none History of harm to others?: No Assessment of Violence: None Noted Violent Behavior Description: none Does patient have access to weapons?: No Criminal Charges Pending?: No Does patient have a court date: No Is patient on probation?: No  Psychosis Hallucinations: None noted Delusions: None noted  Mental Status Report Appearance/Hygiene: In scrubs Eye Contact: Fair Motor Activity: Freedom of movement Speech: Tangential Level of Consciousness: Alert Mood: Anxious, Irritable Affect: Anxious Anxiety Level: Severe Thought Processes: Flight of Ideas Judgement: Impaired Orientation: Person, Place, Time, Situation Obsessive Compulsive Thoughts/Behaviors: None  Cognitive Functioning Concentration: Poor Memory: Unable to Assess Is patient IDD: No Insight: Poor Impulse Control: Poor Appetite: (UTA) Have you had any weight changes? : (UTA) Sleep: Unable to Assess Total Hours of Sleep: (UTA) Vegetative Symptoms: None  ADLScreening [facility] Assessment Services) Patient's cognitive ability adequate to safely complete daily activities?: Yes Patient able to express  need for assistance with ADLs?: Yes Independently performs ADLs?: Yes (appropriate for developmental age)  Prior Inpatient Therapy Prior Inpatient Therapy: Yes Prior Therapy Dates: 07/2019 Prior Therapy Facilty/Provider(s): [facility] Reason for Treatment: mania  Prior Outpatient Therapy Prior Outpatient Therapy: No Does patient have an ACCT team?: No Does patient have Intensive In-House Services?  : No Does patient have [facility] services? : No Does patient have P4CC services?: No  ADL Screening (condition at time of admission) Patient's cognitive ability adequate to safely complete daily activities?: Yes Is the patient deaf or have difficulty hearing?: No Does the patient have difficulty seeing, even when wearing glasses/contacts?: No Does the patient have difficulty concentrating, remembering, or making decisions?: No Patient able to express need for assistance with ADLs?: Yes Does the patient have difficulty dressing or bathing?: No Independently performs ADLs?: Yes (appropriate for developmental age) Does the patient have difficulty walking or climbing stairs?: No Weakness of Legs: None Weakness of Arms/Hands: None  Home Assistive Devices/Equipment Home Assistive Devices/Equipment: None  Therapy Consults (therapy consults require a physician order) PT Evaluation Needed: No OT Evalulation Needed: No SLP Evaluation Needed: No Abuse/Neglect Assessment (Assessment to be complete while patient is alone) Abuse/Neglect Assessment Can Be Completed: Yes Physical Abuse: Denies Verbal Abuse: Denies Sexual Abuse: Denies Self-Neglect: Denies Values / Beliefs Cultural Requests During Hospitalization: None Consults Spiritual Care Consult Needed: No Transition of Care Team Consult Needed: No Advance Directives (For Healthcare) Does Patient Have a Medical Advance Directive?: No Would patient like information on creating a medical advance directive?: No - Patient declined           Disposition: Shuvon Rankin, NP recommends in patient treatment.  TTS to seek placement. Disposition Initial Assessment Completed for this Encounter: Yes  This service was provided via telemedicine using a 2-way, interactive audio and video technology.  Names of all persons participating in this telemedicine service and their role in this encounter. Name: Jihaad Bruschi Role: patient  Name: Orvis Brill, LCSW Role: TTS  Name:  Role:   Name:  Role:     Orvis Brill 07/31/2019 12:39 PM

## 2019-07-31 NOTE — BHH Counselor (Signed)
Eric Rankin, NP recommends in patient treatment. TTS to seek placement. 

## 2019-07-31 NOTE — ED Provider Notes (Signed)
[facility] EMERGENCY DEPARTMENT Provider Note   CSN: [id number] Arrival date & time:        History Chief Complaint  Patient presents with  . Medical Clearance    Eric Baldwin is a 44 y.o. male.  Patient is a 44 year old male with past medical history of schizophrenia, psychosis, substance-induced mood disorder, and cannabis abuse.  He is brought by EMS for evaluation of erratic behavior.  According to EMS, patient was found wandering the street talking to himself and acting aggressively toward those he approaches on the street.  Patient adds little to history due to mania.    The history is provided by the patient.       No past medical history on file.  Patient Active Problem List   Diagnosis Date Noted  . Schizophrenia, paranoid (HCC)   . Cannabis use disorder, moderate, in controlled environment (HCC) 07/23/2019  . 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 Psychosis (HCC) 07/23/2019  . Manic behavior (HCC)   . Substance induced mood disorder (HCC) 07/20/2019  . Major depressive disorder, recurrent episode, severe with mixed features (HCC) 02/09/2018  . Screen for STD (sexually transmitted disease) 10/26/2016  . Dysuria 10/26/2016    No past surgical history on file.     Family History  Problem Relation Age of Onset  . Heart disease Father     Social History   Tobacco Use  . Smoking status: Former Smoker    Types: E-cigarettes  . Smokeless tobacco: Never Used  Substance Use Topics  . Alcohol use: Yes    Alcohol/week: 10.0 standard drinks    Types: 5 Cans of beer, 5 Shots of liquor per week  . Drug use: No    Home Medications Prior to Admission medications   Medication Sig Start Date End Date Taking? Authorizing Provider  benztropine (COGENTIN) 1 MG tablet Take 1 tablet (1 mg total) by mouth 2 (two) times daily. For prevention of tremors 07/26/19   Armandina Stammer I, NP  hydrOXYzine (ATARAX/VISTARIL) 25 MG tablet Take 1  tablet (25 mg total) by mouth 3 (three) times daily as needed for anxiety. 07/26/19   Armandina Stammer I, NP  risperiDONE (RISPERDAL) 3 MG tablet Take 1 tablet (3 mg) by mouth in the morning & 2 tablets (6 mg) at bedtime: For mood control 07/26/19   Armandina Stammer I, NP  traZODone (DESYREL) 100 MG tablet Take 2 tablets (200 mg total) by mouth at bedtime. For sleep 07/26/19   Armandina Stammer I, NP    Allergies    Mango flavor  Review of Systems   Review of Systems  Unable to perform ROS: Psychiatric disorder    Physical Exam Updated Vital Signs BP 104/71   Pulse (!) 110   Temp 98.8 F (37.1 C)   Resp (!) 22   SpO2 95%   Physical Exam Vitals and nursing note reviewed.  Constitutional:      General: He is not in acute distress.    Appearance: He is well-developed. He is not diaphoretic.  HENT:     Head: Normocephalic and atraumatic.  Cardiovascular:     Rate and Rhythm: Normal rate and regular rhythm.     Heart sounds: No murmur. No friction rub.  Pulmonary:     Effort: Pulmonary effort is normal. No respiratory distress.     Breath sounds: Normal breath sounds. No wheezing or rales.  Abdominal:     General: Bowel sounds are normal. There is no distension.  Palpations: Abdomen is soft.     Tenderness: There is no abdominal tenderness.  Musculoskeletal:        General: Normal range of motion.     Cervical back: Normal range of motion and neck supple.  Skin:    General: Skin is warm and dry.  Neurological:     Mental Status: He is alert and oriented to person, place, and time.     Coordination: Coordination normal.  Psychiatric:        Attention and Perception: He perceives visual hallucinations.        Mood and Affect: Affect is inappropriate.        Speech: Speech is rapid and pressured and tangential.        Behavior: Behavior is agitated, aggressive and hyperactive.        Judgment: Judgment is inappropriate.     ED Results / Procedures / Treatments   Labs (all labs  ordered are listed, but only abnormal results are displayed) Labs Reviewed  COMPREHENSIVE METABOLIC PANEL  ETHANOL  SALICYLATE LEVEL  ACETAMINOPHEN LEVEL  CBC  RAPID URINE DRUG SCREEN, HOSP PERFORMED    EKG None  Radiology No results found.  Procedures Procedures (including critical care time)  Medications Ordered in ED Medications  ziprasidone (GEODON) injection 20 mg (has no administration in time range)    ED Course  I have reviewed the triage vital signs and the nursing notes.  Pertinent labs & imaging results that were available during my care of the patient were reviewed by me and considered in my medical decision making (see chart for details).    MDM Rules/Calculators/A&P  Patient is a 44 year old male with history of psychiatric illness presenting with complaints of mania.  Patient was apparently found wandering the street talking to himself and being aggressive towards losing came across.  Patient is having a conversation with himself and people who were not present in his room.  He appears very manic and is difficult to redirect.  He is displaying tangential speech and flight of ideas.  He is very agitated and aggressive.  Patient representing a danger to himself and other patient/staff due to his odd behavior.  He was given IM Geodon.  Patient's laboratory studies thus far are unremarkable.  TTS has been consulted and final disposition will be determined.  Care signed out to oncoming provider at shift change.  Final Clinical Impression(s) / ED Diagnoses Final diagnoses:  None    Rx / DC Orders ED Discharge Orders    None       Veryl Speak, MD 07/31/19 [phone]

## 2019-07-31 NOTE — ED Notes (Signed)
Lunch Tray Ordered @ 1021. 

## 2019-07-31 NOTE — ED Notes (Signed)
Pt beginning to get loud, increasing paranoia, talking about how someone is going to break into his apartment, or steal his car, and how he is going to quit his job because his family is out to get him.

## 2019-07-31 NOTE — ED Notes (Signed)
Pt given a [place] sandwich and water per his request.

## 2019-07-31 NOTE — ED Notes (Signed)
Wound care was applied ton both foot .both was wrapped with clings 4x4 s x2  vasline

## 2019-07-31 NOTE — ED Notes (Signed)
Pt to be accepted at [facility], 2/24 after 0900. Will need to be transported with law inforcement due to IVC.

## 2019-07-31 NOTE — ED Notes (Signed)
Lunch just arrived

## 2019-07-31 NOTE — ED Notes (Signed)
Dinner tray ordered.

## 2019-07-31 NOTE — ED Triage Notes (Signed)
Patient was found walking on the side of the road. Pt is currently manic and denies taking medication. Denies HI and SI. States that people are doing things to him.

## 2019-07-31 NOTE — ED Notes (Signed)
Pt very loud and manic. Asked pt to return to his room and this RN will come to his room and speak with him. Pt cooperative. Previous RN states that he has been increasing in mania and paranoia. Will text md and ask for another dose of medication.

## 2019-08-01 MED ORDER — LORAZEPAM 1 MG PO TABS
2.0000 mg | ORAL_TABLET | Freq: Once | ORAL | Status: DC
  Filled 2019-08-01: qty 2

## 2019-08-01 MED ORDER — HALOPERIDOL LACTATE 5 MG/ML IJ SOLN: 5.0000 mg | Freq: Once | INTRAMUSCULAR | Status: DC

## 2019-08-01 MED ORDER — LORAZEPAM 2 MG/ML IJ SOLN: 2.0000 mg | Freq: Once | INTRAMUSCULAR | Status: DC

## 2019-08-01 MED ORDER — HALOPERIDOL 5 MG PO TABS
5.0000 mg | ORAL_TABLET | Freq: Once | ORAL | Status: AC
  Administered 2019-08-01: 5 mg via ORAL
  Filled 2019-08-01: qty 1

## 2019-08-01 NOTE — ED Notes (Addendum)
Pt c/o bilat foot pain. States that the dressings on his feet are too tight. Removed Kerlix and rewrapped with clean, lose Kerlix. Left Xeroform dressings in place under Kerlix. Tylenol given for pain.

## 2019-08-01 NOTE — ED Provider Notes (Signed)
Patient has been accepted to [facility].  He is alert and well sitting at the bedside watching television.  He is eaten.  He is verbally interactive.  No signs of distress.  Vital signs and labs reviewed.  Patient stable for transfer.  Accepted at [facility] by Dr. Loyola Mast is for psychosis.   Arby Barrette, MD 08/01/19 [phone]

## 2019-08-01 NOTE — ED Notes (Signed)
IVC/Inpt tx  Breakfast ordered

## 2019-08-16 MED FILL — Ziprasidone Mesylate For Inj 20 MG (Base Equivalent): INTRAMUSCULAR | Qty: 20 | Status: AC

## 2019-09-10 ENCOUNTER — Ambulatory Visit: Payer: BC Managed Care – PPO | Admitting: Family Medicine

## 2019-09-10 ENCOUNTER — Encounter: Payer: Self-pay | Admitting: Family Medicine

## 2019-09-10 ENCOUNTER — Other Ambulatory Visit: Payer: Self-pay

## 2019-09-10 VITALS — BP 127/83 | HR 92 | Temp 98.0°F | Ht 70.0 in | Wt 159.0 lb

## 2019-09-10 DIAGNOSIS — F418 Other specified anxiety disorders: Secondary | ICD-10-CM

## 2019-09-10 DIAGNOSIS — F29 Unspecified psychosis not due to a substance or known physiological condition: Secondary | ICD-10-CM

## 2019-09-10 DIAGNOSIS — F200 Paranoid schizophrenia: Secondary | ICD-10-CM

## 2019-09-10 MED ORDER — SERTRALINE HCL 100 MG PO TABS: 100.0000 mg | ORAL_TABLET | Freq: Every day | ORAL | 1 refills | Status: AC

## 2019-09-10 NOTE — Progress Notes (Signed)
Subjective:  Patient ID: Eric Baldwin, male    DOB: 1976/01/07  Age: 44 y.o. MRN: [id number]  CC:  Chief Complaint  Patient presents with  . Medication Refill    on Zoloft. pt states this medication is working well, but pt is under some stress due to resently being homless and his mother having a stroke.PHq-9 score of 10  . FMLA paper work    pt had a panic attack/ nervous brake down due to stress and worring about his mother. pt hasnt been to work due since 08/20/2019. pt would like the paperwork to extend him out of work until june 6th.     HPI Eric Baldwin presents for   Hospital follow-up from acute psychosis, paranoid schizophrenia. Admitted February 15 through the 18th.  Zoloft was discontinued, he was started on Cogentin 1 mg twice daily for tremors, hydroxyzine 25 mg 3 times daily, Risperdal 3 mg in the morning, 6 mg at bedtime for mood control and trazodone 200 mg at bedtime for sleep.  Repeat emergency room evaluation February 23rd -arrived by EMS due to erratic behavior.  Per ER notes he appeared manic and difficulty redirect.  Tangential speech with flight of ideas, agitated and aggressive.  Was treated with IM Geodon.  Was accepted at [facility] by Dr. Selinda Flavin for psychosis.  Today he reports mom had stoke on Feb 22nd. Had a few breakdowns.  Was in [place] for about 7 days.  No current psychiatrist. Has not been back to work - did [employer]. Out of work since Feb 15th. Works at [employer].  Homeless now - living with a friend, planning on moving back home with mom in [place], [place]. Will need to see what judge says after 30 days detox.  Had DUI about a year ago. Will be taking an assessment for DUI. Plans on inpatient detox for 30 days for alcohol - plans on this in next week. Either inpatient detox or jail.  Still some drinks - few drinks past weekend.  Would like FMLA through June 6th to allow time to find somewhere to stay after rehab if unable to go visit mom.  Plans on transfer if able to move to [facility].   He is not taking risperdal, cogentin, hydroxyzine or trazodone. States these upset stomach. Only taking sertraline 100mg  qd. Denies SI/HI.  Denies current paranoia symptoms. Denies a/v/t hallucinations or delusions.  Feels like doing ok on just zoloft for now.    Depression screen [facility] 2/9 09/10/2019 01/22/2019 12/11/2018 08/29/2017 07/14/2017  Decreased Interest 2 2 0 0 3  Down, Depressed, Hopeless 1 0 0 0 1  PHQ - 2 Score 3 2 0 0 4  Altered sleeping 2 1 - - 2  Tired, decreased energy 1 0 - - 0  Change in appetite 1 0 - - 2  Feeling bad or failure about yourself  1 0 - - 2  Trouble concentrating 1 0 - - 2  Moving slowly or fidgety/restless 1 0 - - 2  Suicidal thoughts 0 0 - - 0  PHQ-9 Score 10 3 - - 14  Difficult doing work/chores - - - - Very difficult    History Patient Active Problem List   Diagnosis Date Noted  . Schizophrenia, paranoid ([place])   . Cannabis use disorder, moderate, in controlled environment ([place]) 07/23/2019  . 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 Psychosis ([place]) 07/23/2019  . Manic behavior ([place])   . Substance induced mood disorder ([place]) 07/20/2019  .  Major depressive disorder, recurrent episode, severe with mixed features (HCC) 02/09/2018  . Screen for STD (sexually transmitted disease) 10/26/2016  . Dysuria 10/26/2016   No past medical history on file. No past surgical history on file. Allergies  Allergen Reactions  . Mango Flavor Rash   Prior to Admission medications   Medication Sig Start Date End Date Taking? Authorizing Provider  sertraline (ZOLOFT) 100 MG tablet Take 100 mg by mouth daily. 07/30/19  Yes [provider]  benztropine (COGENTIN) 1 MG tablet Take 1 tablet (1 mg total) by mouth 2 (two) times daily. For prevention of tremors Patient not taking: Reported on 09/10/2019 07/26/19   Armandina Stammer I, NP  hydrOXYzine (ATARAX/VISTARIL) 25 MG tablet Take 1 tablet (25 mg total) by mouth 3  (three) times daily as needed for anxiety. Patient not taking: Reported on 09/10/2019 07/26/19   Armandina Stammer I, NP  risperiDONE (RISPERDAL) 3 MG tablet Take 1 tablet (3 mg) by mouth in the morning & 2 tablets (6 mg) at bedtime: For mood control Patient not taking: Reported on 09/10/2019 07/26/19   Armandina Stammer I, NP  traZODone (DESYREL) 100 MG tablet Take 2 tablets (200 mg total) by mouth at bedtime. For sleep Patient not taking: Reported on 09/10/2019 07/26/19   Sanjuana Kava, NP   Social History   Socioeconomic History  . Marital status: Single    Spouse name: Not on file  . Number of children: Not on file  . Years of education: Not on file  . Highest education level: Not on file  Occupational History  . Not on file  Tobacco Use  . Smoking status: Former Smoker    Types: E-cigarettes  . Smokeless tobacco: Never Used  Substance and Sexual Activity  . Alcohol use: Yes    Alcohol/week: 10.0 standard drinks    Types: 5 Cans of beer, 5 Shots of liquor per week  . Drug use: No  . Sexual activity: Not on file  Other Topics Concern  . Not on file  Social History Narrative   ** Merged History Encounter **       Social Determinants of Health   Financial Resource Strain:   . Difficulty of Paying Living Expenses:   Food Insecurity:   . Worried About Programme researcher, broadcasting/film/video in the Last Year:   . Barista in the Last Year:   Transportation Needs:   . Freight forwarder (Medical):   Marland Kitchen Lack of Transportation (Non-Medical):   Physical Activity:   . Days of Exercise per Week:   . Minutes of Exercise per Session:   Stress:   . Feeling of Stress :   Social Connections:   . Frequency of Communication with Friends and Family:   . Frequency of Social Gatherings with Friends and Family:   . Attends Religious Services:   . Active Member of Clubs or Organizations:   . Attends Banker Meetings:   Marland Kitchen Marital Status:   Intimate Partner Violence:   . Fear of Current or  Ex-Partner:   . Emotionally Abused:   Marland Kitchen Physically Abused:   . Sexually Abused:     Review of Systems  Psychiatric/Behavioral: Negative for hallucinations, self-injury and suicidal ideas.   Per HPI.   Objective:   Vitals:   09/10/19 1319  BP: 127/83  Pulse: 92  Temp: 98 F (36.7 C)  TempSrc: Temporal  SpO2: 99%  Weight: 159 lb (72.1 kg)  Height: 5\' 10"  (1.778 m)  Physical Exam Vitals reviewed.  Constitutional:      General: He is not in acute distress.    Appearance: Normal appearance. He is well-developed. He is not ill-appearing or diaphoretic.  HENT:     Head: Normocephalic and atraumatic.  Eyes:     Pupils: Pupils are equal, round, and reactive to light.  Neck:     Vascular: No carotid bruit or JVD.  Cardiovascular:     Rate and Rhythm: Normal rate and regular rhythm.     Heart sounds: Normal heart sounds. No murmur.  Pulmonary:     Effort: Pulmonary effort is normal.     Breath sounds: Normal breath sounds. No rales.  Skin:    General: Skin is warm and dry.  Neurological:     Mental Status: He is alert and oriented to person, place, and time.  Psychiatric:        Attention and Perception: Attention and perception normal.        Mood and Affect: Mood and affect normal.        Speech: Speech normal.        Behavior: Behavior normal. Behavior is cooperative.        Thought Content: Thought content is not paranoid or delusional. Thought content does not include homicidal or suicidal ideation.        Assessment & Plan:  Lucion Dilger is a 44 y.o. male . Depression with anxiety - Plan: sertraline (ZOLOFT) 100 MG tablet  Psychosis, unspecified psychosis type (HCC)  Paranoid schizophrenia (HCC)  Status post hospitalization in February with acute psychosis at that time.  We discussed the discharge medications, which he has stopped due to intolerance.  I did express concerns with just use of Zoloft as not likely will not be sufficient given his  previous mental health history.  Refilled zoloft for now.   -Will need to follow-up with psychiatry, and will check to see if can be seen by psychiatrist through behavioral health.  -FMLA paperwork to be completed for time in hospital to present and for planned inpatient rehab for prior DUI, alcohol use.   Meds ordered this encounter  Medications  . sertraline (ZOLOFT) 100 MG tablet    Sig: Take 1 tablet (100 mg total) by mouth daily.    Dispense:  30 tablet    Refill:  1   Patient Instructions    Although I did refill the sertraline today, most recent medications from your hospitalization were different.  I do need you to follow-up with psychiatry to adjust your medications.  Make sure you let them know about any side effects or issues you had with your previous medications.  We will try to arrange an appointment with psychiatrist.  I will review the FMLA paperwork and should have ready for you in the next few days.  I may need further information and will call you.   If you have lab work done today you will be contacted with your lab results within the next 2 weeks.  If you have not heard from [place] then please contact us. The fastest way to get your results is to register for My Chart.   IF you received an x-ray today, you will receive an invoice from [facility]. Please contact [facility] at [phone] with questions or concerns regarding your invoice.   IF you received labwork today, you will receive an invoice from [place]. Please contact [facility] at [phone] with questions or concerns regarding your invoice.   Our billing  staff will not be able to assist you with questions regarding bills from these companies.  You will be contacted with the lab results as soon as they are available. The fastest way to get your results is to activate your My Chart account. Instructions are located on the last page of this paperwork. If you have not heard from [place] regarding the  results in 2 weeks, please contact this office.         Signed, Meredith Staggers, MD [facility]

## 2019-09-10 NOTE — Patient Instructions (Addendum)
  Although I did refill the sertraline today, most recent medications from your hospitalization were different.  I do need you to follow-up with psychiatry to adjust your medications.  Make sure you let them know about any side effects or issues you had with your previous medications.  We will try to arrange an appointment with psychiatrist.  I will review the FMLA paperwork and should have ready for you in the next few days.  I may need further information and will call you.   If you have lab work done today you will be contacted with your lab results within the next 2 weeks.  If you have not heard from [place] then please contact us. The fastest way to get your results is to register for My Chart.   IF you received an x-ray today, you will receive an invoice from [facility]. Please contact [facility] at [phone] with questions or concerns regarding your invoice.   IF you received labwork today, you will receive an invoice from [place]. Please contact [facility] at [phone] with questions or concerns regarding your invoice.   Our billing staff will not be able to assist you with questions regarding bills from these companies.  You will be contacted with the lab results as soon as they are available. The fastest way to get your results is to activate your My Chart account. Instructions are located on the last page of this paperwork. If you have not heard from [place] regarding the results in 2 weeks, please contact this office.

## 2019-09-11 ENCOUNTER — Encounter: Payer: Self-pay | Admitting: Family Medicine

## 2019-09-12 ENCOUNTER — Telehealth: Payer: Self-pay

## 2019-09-12 NOTE — Telephone Encounter (Signed)
Called pt to inform him his FMLA paper work has been completed by Dr. Chilton Si. Informed pt we are missing a signature from him to be able to fax this paper work and that he would need to come in and sign. Pt stated he will come in some time today to pick up/sign paper work

## 2019-09-14 NOTE — Telephone Encounter (Signed)
I have refaxed the [place] fmla paperwork completed and have received a successful confirmation in return . I also called patient and informed him.

## 2019-09-18 ENCOUNTER — Telehealth: Payer: Self-pay | Admitting: Family Medicine

## 2019-09-18 NOTE — Progress Notes (Signed)
Mail an unable to contact letter out to pt on 09/18/2019.

## 2019-09-18 NOTE — Telephone Encounter (Signed)
Referral note indicated patient has nonworking phone number.  I have been able to contact him previously on provided number, but if we are unable to get in touch with him with provided numbers at this time, please send unable to reach letter, so we can get appropriate contact information and forward that to psychiatry as he will need to be seen there in follow-up.

## 2021-12-23 IMAGING — DX DG FOOT COMPLETE 3+V*R*
3 series · 3 of 3 positions shown · non-contrast
Comparison: None.

CLINICAL DATA: Found walking on the side of the road, manic, denies
taking medication, multiple lacerations

EXAM:
RIGHT FOOT COMPLETE - 3+ VIEW

[foot ap]
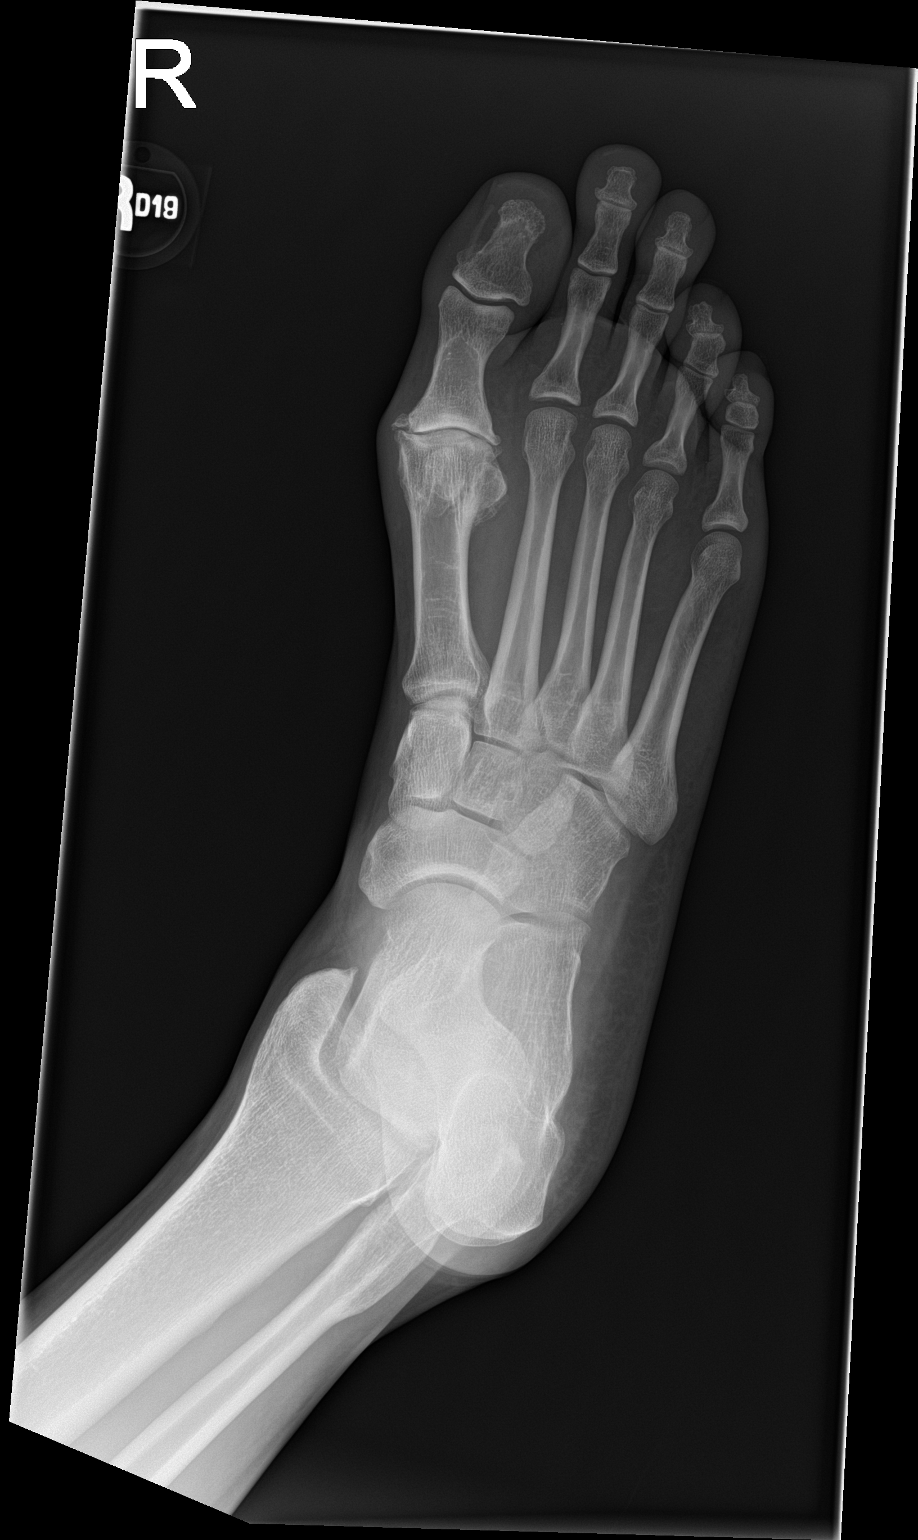

[foot obl]
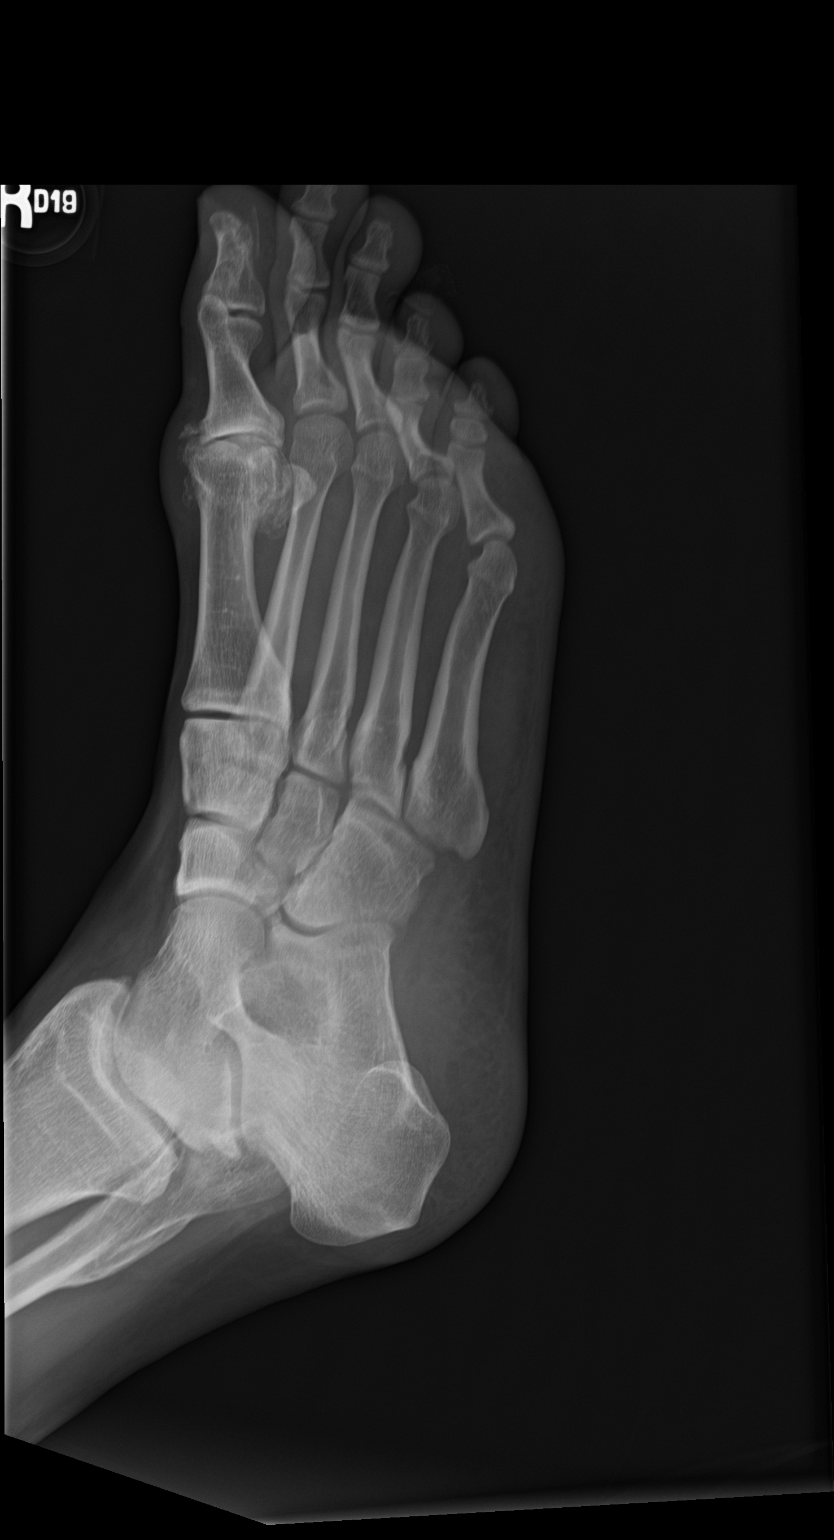

[foot lat]
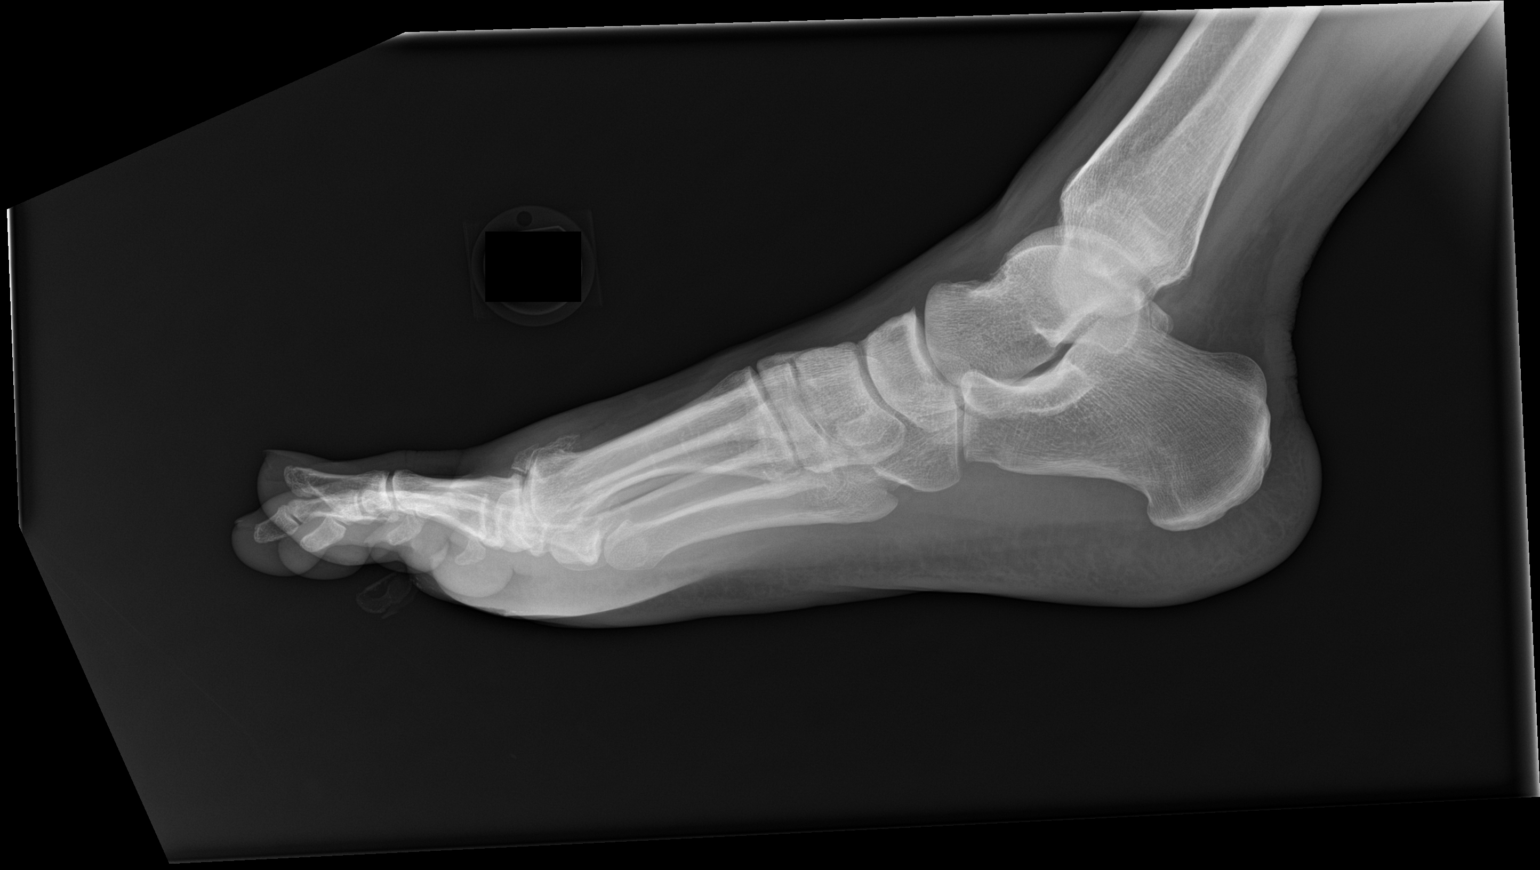

[3 of 3 positions shown; findings below may reference images not displayed]

FINDINGS: Focal degenerative changes at the first metatarsophalangeal joint
with corticated fragment seen medially at the base of the first
proximal phalanx. Remaining joint spaces are fairly well preserved.
No acute fracture or traumatic malalignment is present. No soft
tissue gas or foreign body is seen. No erosive or destructive
changes or suspicious periostitis to suggest early changes of
osteomyelitis.
IMPRESSION: Focal degenerative changes and corticated fragments at the first
metatarsophalangeal joint. Could correlate with clinical features
for hallux rigidus/limitus.

No acute osseous abnormality or suspicious soft tissue findings. No
convincing features of osteomyelitis.
